# Patient Record
Sex: Female | Born: 1995 | Race: White | Hispanic: No | Marital: Single | Smoking: Never smoker
Health system: Southern US, Community
[De-identification: ages and names within clinical notes are randomized; demographics above are authoritative.]

## PROBLEM LIST (undated history)

## (undated) DIAGNOSIS — G43909 Migraine, unspecified, not intractable, without status migrainosus: Secondary | ICD-10-CM

## (undated) HISTORY — PX: OTHER SURGICAL HISTORY: SHX169

## (undated) HISTORY — DX: Migraine, unspecified, not intractable, without status migrainosus: G43.909

---
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
## 2016-01-11 ENCOUNTER — Ambulatory Visit (INDEPENDENT_AMBULATORY_CARE_PROVIDER_SITE_OTHER): Payer: Medicaid Other | Admitting: Neurology

## 2016-01-11 ENCOUNTER — Encounter: Payer: Self-pay | Admitting: Neurology

## 2016-01-11 VITALS — BP 110/70 | Ht 59.5 in | Wt 119.9 lb

## 2016-01-11 DIAGNOSIS — G44209 Tension-type headache, unspecified, not intractable: Secondary | ICD-10-CM | POA: Diagnosis not present

## 2016-01-11 DIAGNOSIS — G43009 Migraine without aura, not intractable, without status migrainosus: Secondary | ICD-10-CM

## 2016-01-11 DIAGNOSIS — G43701 Chronic migraine without aura, not intractable, with status migrainosus: Secondary | ICD-10-CM | POA: Insufficient documentation

## 2016-01-11 DIAGNOSIS — F329 Major depressive disorder, single episode, unspecified: Secondary | ICD-10-CM | POA: Diagnosis not present

## 2016-01-11 DIAGNOSIS — G4723 Circadian rhythm sleep disorder, irregular sleep wake type: Secondary | ICD-10-CM | POA: Diagnosis not present

## 2016-01-11 DIAGNOSIS — F411 Generalized anxiety disorder: Secondary | ICD-10-CM | POA: Diagnosis not present

## 2016-01-11 DIAGNOSIS — F32A Depression, unspecified: Secondary | ICD-10-CM | POA: Insufficient documentation

## 2016-01-11 MED ORDER — SUMATRIPTAN SUCCINATE 50 MG PO TABS: 50.0000 mg | ORAL_TABLET | ORAL | Status: DC | PRN

## 2016-01-11 NOTE — Progress Notes (Signed)
Patient: Nicole Francis MRN: [id number] Sex: female DOB: 09/09/1996  Provider: Keturah Shavers, MD Location of Care: [facility]  Note type: New patient consultation  Referral Source: Dr. Suzanna Obey History from: patient and referring office Chief Complaint: Migraines  History of Present Illness: Nicole Francis is a 20 y.o. female has been referred for evaluation and management of headaches. As per patient she has been having headaches off and on for the past few years but recently they have been more frequent, on average once a week for which she may need to take OTC medications. The headache is described as global headache with various intensity from 3-9 out of 10, accompanied by photophobia and phonophobia, occasional nausea and vomiting, occasional dizziness. The headaches may last for several hours to a few days and occasionally more than a week. Usually the headaches do not respond to medications although she is not taking appropriate dose of medications. She has tried 500 MG Tylenol as well as 25 mg of Imitrex with no significant help. She has some difficulty sleeping through the night with falling sleep late and frequent awakening from sleep. She may be sleepy throughout the day. She is also having significant stress or anxiety issues as well as depressed mood for which she has been followed by psychologist and has been on antidepressant medication. She is doing fairly well at school and she is going to start college next year. There is family history of headache in her mother. She lives with her grandmother and there lot of family social issues going on.  Review of Systems: 12 system review as per HPI, otherwise negative.  History reviewed. No pertinent past medical history. Hospitalizations: No., Head Injury: No., Nervous System Infections: No., Immunizations up to date: Yes.    Birth History She was born full-term via C-section with no perinatal events.  Her birthweight was 7 lbs. 8 oz. She developed all her milestones on time.  Surgical History Past Surgical History  Procedure Laterality Date  . Other surgical history Left     Nexplanon     Family History family history includes ADD / ADHD in her brother; Bipolar disorder in her mother; High Cholesterol in her mother; High blood pressure in her mother; Migraines in her mother.  Social History Social History   Social History  . Marital Status: Single    Spouse Name: N/A  . Number of Children: 0  . Years of Education: N/A   Social History Main Topics  . Smoking status: Never Smoker   . Smokeless tobacco: Never Used  . Alcohol Use: No  . Drug Use: No  . Sexual Activity: Yes    Birth Control/ Protection: Implant   Other Topics Concern  . None   Social History Narrative   Wenonah attends 12 th grade at [employer]. She is doing well.   She lives with her maternal grandmother and maternal half brother.      The medication list was reviewed and reconciled. All changes or newly prescribed medications were explained.  A complete medication list was provided to the patient/caregiver.  No Known Allergies  Physical Exam BP 110/70 mmHg  Ht 4' 11.5" (1.511 m)  Wt 119 lb 14.9 oz (54.4 kg)  BMI 23.83 kg/m2 Gen: Awake, alert, not in distress Skin: Eczema rash over the neck area, No neurocutaneous stigmata. HEENT: Normocephalic, no conjunctival injection, nares patent, mucous membranes moist, oropharynx clear. Neck: Supple, no meningismus. No focal tenderness. Resp: Clear to  auscultation bilaterally CV: Regular rate, normal S1/S2, no murmurs, no rubs Abd: BS present, abdomen soft, non-tender, non-distended. No hepatosplenomegaly or mass Ext: Warm and well-perfused. No deformities, no muscle wasting, ROM full.  Neurological Examination: MS: Awake, alert, interactive. Normal eye contact, answered the questions appropriately, speech was fluent,  Normal comprehension.   Attention and concentration were normal. Cranial Nerves: Pupils were equal and reactive to light ( 5-483mm);  normal fundoscopic exam with sharp discs, visual field full with confrontation test; EOM normal, no nystagmus; no ptsosis, no double vision, intact facial sensation, face symmetric with full strength of facial muscles, hearing intact to finger rub bilaterally, palate elevation is symmetric, tongue protrusion is symmetric with full movement to both sides.  Sternocleidomastoid and trapezius are with normal strength. Tone-Normal Strength-Normal strength in all muscle groups DTRs-  Biceps Triceps Brachioradialis Patellar Ankle  R 2+ 2+ 2+ 3+ 3+  L 2+ 2+ 2+ 3+ 3+   Plantar responses flexor bilaterally, no clonus noted Sensation: Intact to light touch, Romberg negative. Coordination: No dysmetria on FTN test. No difficulty with balance. Gait: Normal walk and run. Tandem gait was normal. Was able to perform toe walking and heel walking without difficulty.   Assessment and Plan 1. Migraine without aura and without status migrainosus, not intractable   2. Tension headache   3. Circadian rhythm sleep disorder, irregular sleep wake type   4. Anxiety state   5. Depression    This is an [age] year old young female with episodes of migraine and tension-type headaches, most likely related to several triggers including anxiety, depression, difficulty sleeping and genetic factors. She has no focal findings on her neurological examination. Discussed the nature of primary headache disorders with patient and family.  Encouraged diet and life style modifications including increase fluid intake, adequate sleep, limited screen time, eating breakfast.  I also discussed the stress and anxiety and association with headache. She will make a headache area and bring it on her next visit. Acute headache management: may take Motrin/Tylenol with appropriate dose (Max 3 times a week) and rest in a dark room. She may also  try 50 MG gram of Imitrex with or without 400 mg of ibuprofen. Preventive management: recommend dietary supplements including magnesium and Vitamin B2 (Riboflavin) which may be beneficial for migraine headaches in some studies. She may also take small dose of melatonin to help her with sleep if she is still having difficulty falling asleep. Since the frequency of headaches are not significantly high, I do not recommend starting a preventive medication but depends on her headache diary, I may consider a preventive medication such as propranolol or Topamax. I would like to see her in 2 months for follow-up visit and adjusting the medications if needed. She understood and agreed with the plan.   Meds ordered this encounter  Medications  . FLUoxetine (PROZAC) 10 MG capsule    Sig: Take 10 mg by mouth daily.   [name]. FLUoxetine (PROZAC) 40 MG capsule    Sig: Take 40 mg by mouth daily.   [name]. DISCONTD: SUMAtriptan (IMITREX) 25 MG tablet    Sig: Take 25 mg by mouth. Reported on 01/11/2016  . etonogestrel (NEXPLANON) 68 MG IMPL implant    Sig: 68 mg by Subdermal route once.   . Magnesium Oxide 500 MG TABS    Sig: Take by mouth.  . riboflavin (VITAMIN B-2) 100 MG TABS tablet    Sig: Take 100 mg by mouth daily.  . Melatonin 5 MG TABS  Sig: Take by mouth.  . SUMAtriptan (IMITREX) 50 MG tablet    Sig: Take 1 tablet (50 mg total) by mouth every 2 (two) hours as needed for migraine. Maximum 2 times a week    Dispense:  10 tablet    Refill:  0

## 2016-01-11 NOTE — Patient Instructions (Signed)
Have appropriate hydration, limited screen time Sleep at specific times every night, melatonin may help with sleep.  need at least 9 hours of sleep every night. Take dietary supplements regularly every day Take 50 mg of Imitrex with 400 mg of ibuprofen at the beginning of headache and sleep in a dark room for 30 minutes

## 2016-03-07 ENCOUNTER — Ambulatory Visit (INDEPENDENT_AMBULATORY_CARE_PROVIDER_SITE_OTHER): Payer: Medicaid Other | Admitting: Neurology

## 2016-03-07 ENCOUNTER — Encounter: Payer: Self-pay | Admitting: Neurology

## 2016-03-07 VITALS — BP 90/62 | Ht 59.75 in | Wt 119.7 lb

## 2016-03-07 DIAGNOSIS — G43009 Migraine without aura, not intractable, without status migrainosus: Secondary | ICD-10-CM

## 2016-03-07 DIAGNOSIS — F32A Depression, unspecified: Secondary | ICD-10-CM

## 2016-03-07 DIAGNOSIS — F411 Generalized anxiety disorder: Secondary | ICD-10-CM

## 2016-03-07 DIAGNOSIS — F329 Major depressive disorder, single episode, unspecified: Secondary | ICD-10-CM | POA: Diagnosis not present

## 2016-03-07 DIAGNOSIS — G44209 Tension-type headache, unspecified, not intractable: Secondary | ICD-10-CM | POA: Diagnosis not present

## 2016-03-07 NOTE — Progress Notes (Signed)
Patient: Nicole Francis MRN: [id number] Sex: female DOB: 1996/04/17  Provider: Keturah ShaversNABIZADEH, Sargon Scouten, MD [facility]  Note type: Routine return visit  Referral Source: Dr. Suzanna Obeyeleste Wallace History from: patient, referring office and [facility] Chief Complaint: Migraines  History of Present Illness: Nicole Francis is a 20 y.o. female is here for follow-up management of headaches. She was seen 2 months ago with episodes of headaches with moderate intensity and frequency with features of both migraine and tension-type headaches as well as having anxiety and depressed mood and difficulty sleeping through the night. On her last visit she was recommended to start taking dietary supplements as well as having appropriate asleep and limited screen time but she was not started on preventive medication. She was on antidepressant medication as well as melatonin at that point. Over the past couple of months she's been doing fairly well with no frequent headaches and over the past month she has not been taking more than one or 2 OTC medications. Currently she is taking dietary supplements and she has been taking Prozac and on therapy as well. She usually sleeps well without any difficulty and overall she thinks that she is doing better in terms of frequency and intensity of the headaches.  Review of Systems: 12 system review as per HPI, otherwise negative.  History reviewed. No pertinent past medical history. Hospitalizations: No., Head Injury: No., Nervous System Infections: No., Immunizations up to date: Yes.    Surgical History Past Surgical History  Procedure Laterality Date  . Other surgical history Left     Nexplanon     Family History family history includes ADD / ADHD in her brother; Bipolar disorder in her mother; High Cholesterol in her mother; High blood pressure in her mother; Migraines in her mother.   Social History Social History   Social History   . Marital Status: Single    Spouse Name: N/A  . Number of Children: 0  . Years of Education: N/A   Social History Main Topics  . Smoking status: Never Smoker   . Smokeless tobacco: Never Used  . Alcohol Use: No  . Drug Use: No  . Sexual Activity: Yes    Birth Control/ Protection: Implant   Other Topics Concern  . None   Social History Narrative   MusicianKendall graduated from high school. She plans on attending [employer] in the fall and majoring in Psychology.   She lives with her adoptive grandmother and biological, maternal half-brother.     The medication list was reviewed and reconciled. All changes or newly prescribed medications were explained.  A complete medication list was provided to the patient/caregiver.  No Known Allergies  Physical Exam BP 90/62 mmHg  Ht 4' 11.75" (1.518 m)  Wt 119 lb 11.4 oz (54.3 kg)  BMI 23.56 kg/m2 Gen: Awake, alert, not in distress Skin: No rash, No neurocutaneous stigmata. HEENT: Normocephalic, no conjunctival injection, nares patent, mucous membranes moist, oropharynx clear. Neck: Supple, no meningismus. No focal tenderness. Resp: Clear to auscultation bilaterally CV: Regular rate, normal S1/S2, no murmurs,  Abd: BS present, abdomen soft, non-tender, non-distended. No hepatosplenomegaly or mass Ext: Warm and well-perfused. No deformities, no muscle wasting,   Neurological Examination: MS: Awake, alert, interactive. Normal eye contact, answered the questions appropriately, speech was fluent,  Normal comprehension.  Attention and concentration were normal. Cranial Nerves: Pupils were equal and reactive to light ( 5-543mm);  normal fundoscopic exam with sharp discs, visual field full with confrontation  test; EOM normal, no nystagmus; no ptsosis, no double vision, intact facial sensation, face symmetric with full strength of facial muscles, hearing intact to finger rub bilaterally, palate elevation is symmetric, tongue protrusion is symmetric with full  movement to both sides.  Sternocleidomastoid and trapezius are with normal strength. Tone-Normal Strength-Normal strength in all muscle groups DTRs-  Biceps Triceps Brachioradialis Patellar Ankle  R 2+ 2+ 2+ 2+ 2+  L 2+ 2+ 2+ 2+ 2+   Plantar responses flexor bilaterally, no clonus noted Sensation: Intact to light touch,  Romberg negative. Coordination: No dysmetria on FTN test. No difficulty with balance. Gait: Normal walk and run. Tandem gait was normal.    Assessment and Plan 1. Migraine without aura and without status migrainosus, not intractable   2. Tension headache   3. Anxiety state   4. Depression    This is an 20 year old young female with episodes of migraine and tension-type headaches as well as anxiety and depressed mood with some difficulty sleeping through the night for which she has been on antidepressant medication and melatonin as well as dietary supplements with fairly good improvement and no frequent headaches and the current time. Since she is doing fairly well, I do not recommend further neurological evaluation or preventive treatment at this point. She will continue with appropriate hydration and sleep and limited screen time as well as continuing taking dietary supplements. She will continue follow with behavioral health service and her therapy. If she develops more frequent headaches, she may call the office to make an appointment to start her on preventive medication otherwise she'll continue follow-up with her primary care physician and I will be available for any question or concerns. She understood and agreed with the plan.

## 2016-04-20 ENCOUNTER — Encounter: Payer: Self-pay | Admitting: Pediatrics

## 2016-04-21 ENCOUNTER — Encounter: Payer: Self-pay | Admitting: Pediatrics

## 2016-06-17 ENCOUNTER — Ambulatory Visit (HOSPITAL_COMMUNITY)
Admission: EM | Admit: 2016-06-17 | Discharge: 2016-06-17 | Disposition: A | Discharge status: Home / Self Care | Payer: Medicaid Other | Attending: Physician Assistant | Admitting: Physician Assistant

## 2016-06-17 ENCOUNTER — Encounter (HOSPITAL_COMMUNITY): Payer: Self-pay | Admitting: Emergency Medicine

## 2016-06-17 DIAGNOSIS — B9689 Other specified bacterial agents as the cause of diseases classified elsewhere: Secondary | ICD-10-CM | POA: Diagnosis not present

## 2016-06-17 DIAGNOSIS — B3731 Acute candidiasis of vulva and vagina: Secondary | ICD-10-CM

## 2016-06-17 DIAGNOSIS — R300 Dysuria: Secondary | ICD-10-CM | POA: Insufficient documentation

## 2016-06-17 DIAGNOSIS — L298 Other pruritus: Secondary | ICD-10-CM | POA: Diagnosis not present

## 2016-06-17 DIAGNOSIS — R102 Pelvic and perineal pain: Secondary | ICD-10-CM | POA: Diagnosis not present

## 2016-06-17 DIAGNOSIS — A499 Bacterial infection, unspecified: Secondary | ICD-10-CM | POA: Diagnosis not present

## 2016-06-17 DIAGNOSIS — N760 Acute vaginitis: Secondary | ICD-10-CM | POA: Diagnosis not present

## 2016-06-17 DIAGNOSIS — B373 Candidiasis of vulva and vagina: Secondary | ICD-10-CM | POA: Diagnosis not present

## 2016-06-17 LAB — POCT URINALYSIS DIP (DEVICE)
BILIRUBIN URINE: NEGATIVE
GLUCOSE, UA: NEGATIVE mg/dL
Hgb urine dipstick: NEGATIVE
KETONES UR: NEGATIVE mg/dL
Nitrite: NEGATIVE
Protein, ur: 100 mg/dL — AB
SPECIFIC GRAVITY, URINE: 1.015 (ref 1.005–1.030)
Urobilinogen, UA: 0.2 mg/dL (ref 0.0–1.0)
pH: 8.5 — ABNORMAL HIGH (ref 5.0–8.0)

## 2016-06-17 LAB — POCT PREGNANCY, URINE: PREG TEST UR: NEGATIVE

## 2016-06-17 MED ORDER — METRONIDAZOLE 500 MG PO TABS: 500.0000 mg | ORAL_TABLET | Freq: Three times a day (TID) | ORAL | 0 refills | Status: DC

## 2016-06-17 MED ORDER — FLUCONAZOLE 200 MG PO TABS: 200.0000 mg | ORAL_TABLET | Freq: Every day | ORAL | 0 refills | Status: AC

## 2016-06-17 NOTE — ED Triage Notes (Signed)
Pt is here for vaginal pain onset x4 weeks ++ associated w/vaginal burning, itching, dysuria, abd pain  Denies vag d/c, fevers  SA in a monogamous relationship and has Nexplanon in place and uses condoms  A&O x4... NAD

## 2016-06-20 LAB — CERVICOVAGINAL ANCILLARY ONLY
Chlamydia: NEGATIVE
Neisseria Gonorrhea: NEGATIVE
Wet Prep (BD Affirm): NEGATIVE

## 2017-07-17 IMAGING — MR MR HEAD W/O CM
9 of 11 series · 33 of 48 positions shown · non-contrast
Comparison: None.

CLINICAL DATA: Left-sided facial numbness and tongue numbness
beginning this morning.

EXAM:
MRI HEAD WITHOUT CONTRAST
TECHNIQUE: Multiplanar, multiecho pulse sequences of the brain and surrounding
structures were obtained without intravenous contrast.

[Series 2: FLAIR · sagittal · 5.0mm · 0.47mm/px · 2 of 24 slices shown (1 of 2)]
[im 1/24]
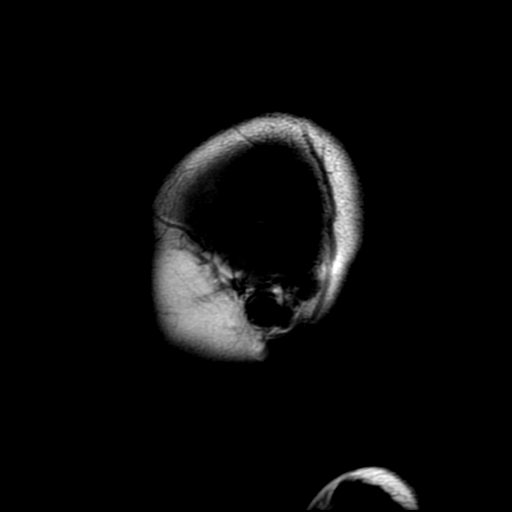
[im 24/24]
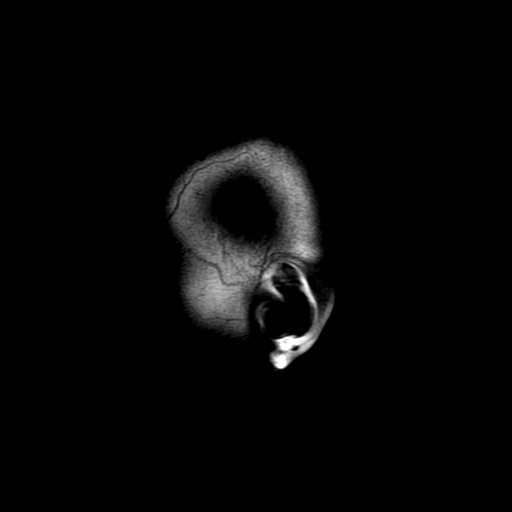

[Series 5: T2 · axial · 5.0mm · 0.45mm/px · z∈[-63,+79]mm · 2 of 25 slices shown (1 of 2)]
[im 1/25]
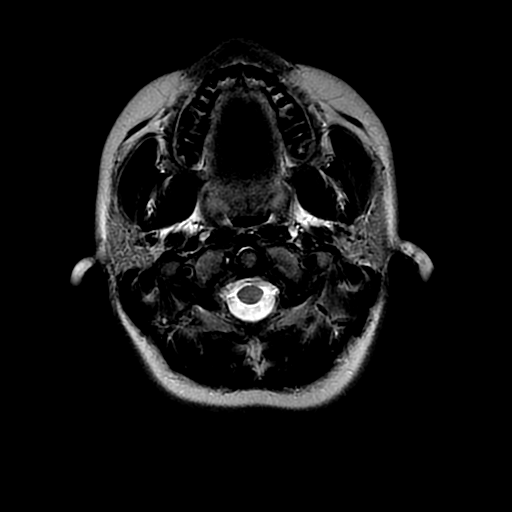
[im 25/25]
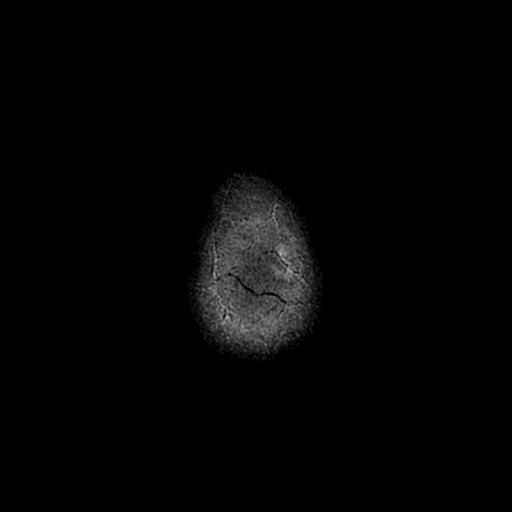

[Series 6: FLAIR · axial · 5.0mm · 0.45mm/px · z∈[-63,+79]mm · 2 of 25 slices shown (2 of 2)]
[im 1/25]
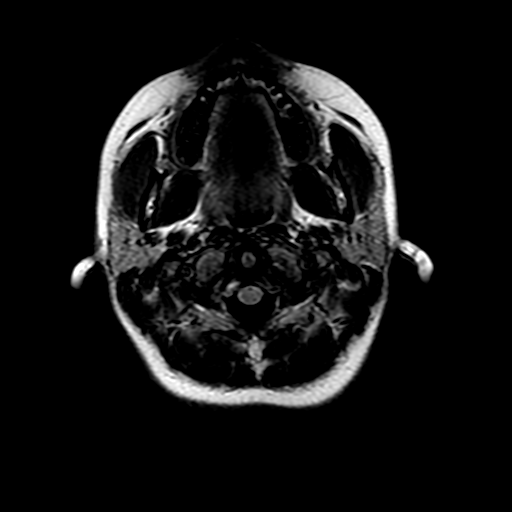
[im 25/25]
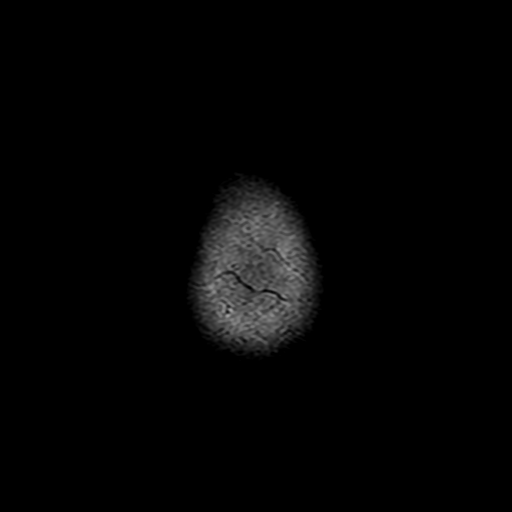

[Series 7: (person_name) · axial · 3.2mm · 0.47mm/px · z∈[-45,+16]mm · 4 of 80 slices shown]
[im 1/80]
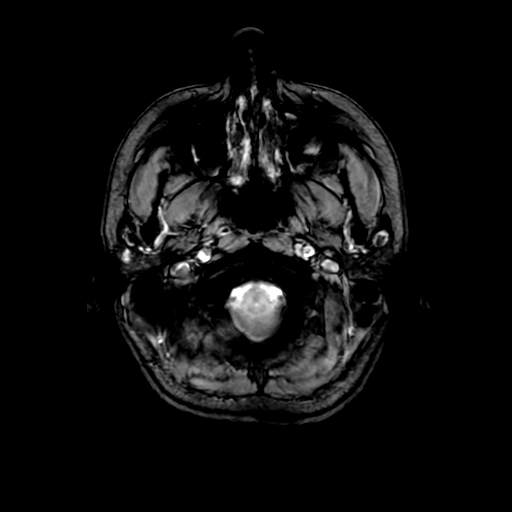
[im 14/80]
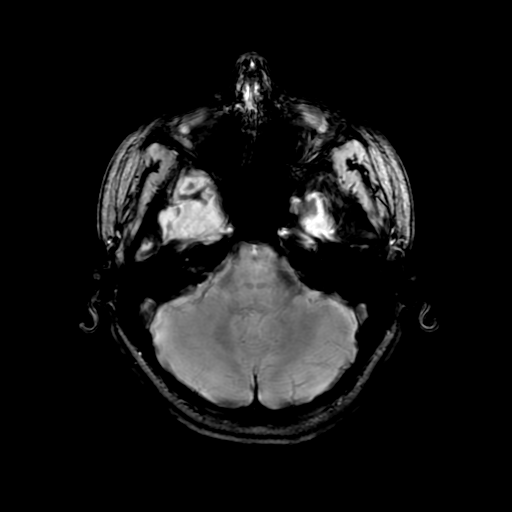
[im 27/80]
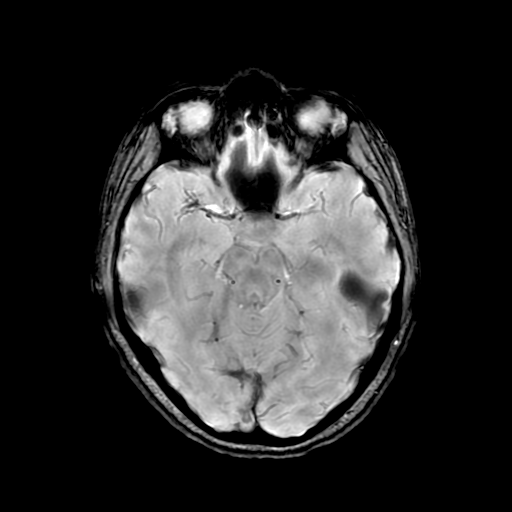
[im 40/80]
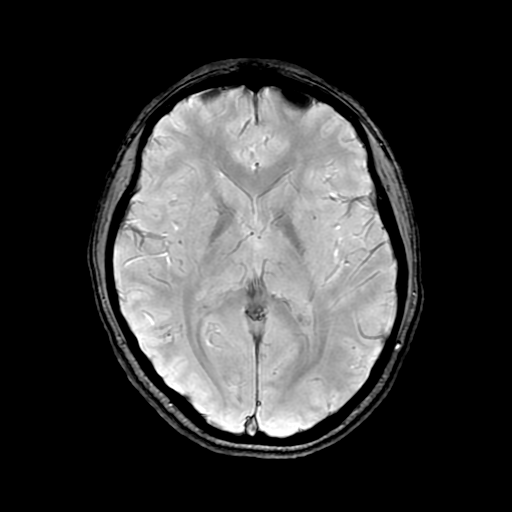

[Series 8: DWI · axial · 3.6mm · 1.02mm/px · z∈[-42,+94]mm · 7 of 78 slices shown (1 of 4)]
[im 1/78]
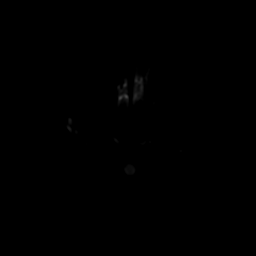
[im 13/78]
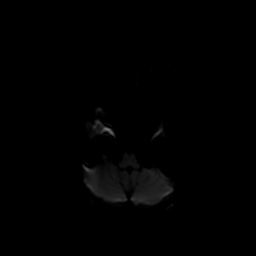
[im 26/78]
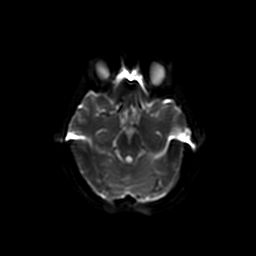
[im 39/78]
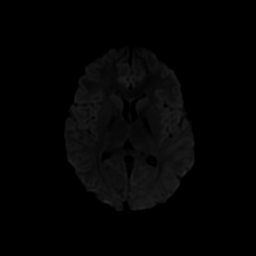
[im 52/78]
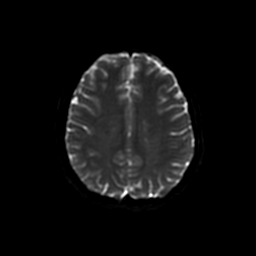
[im 65/78]
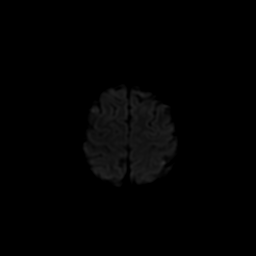
[im 78/78]
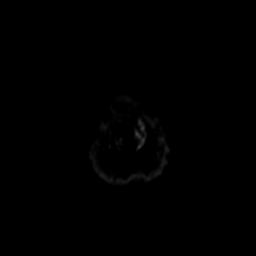

[Series 9: DWI · coronal · 5.0mm · 0.94mm/px · 6 of 64 slices shown (2 of 4)]
[im 1/64]
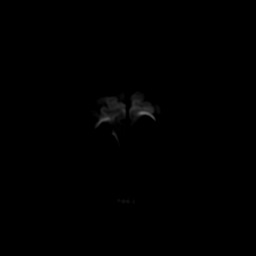
[im 13/64]
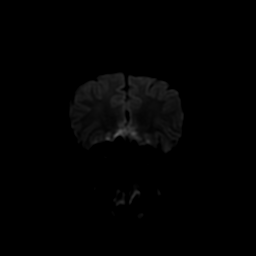
[im 26/64]
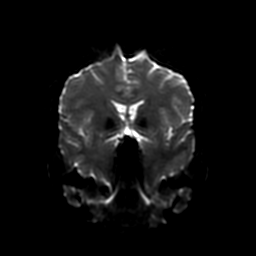
[im 38/64]
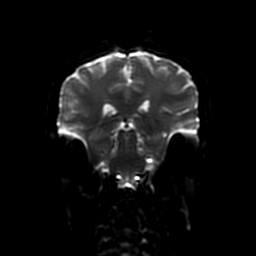
[im 51/64]
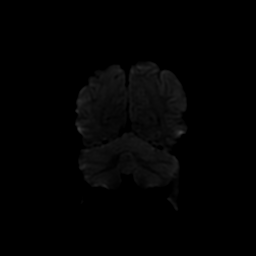
[im 64/64]
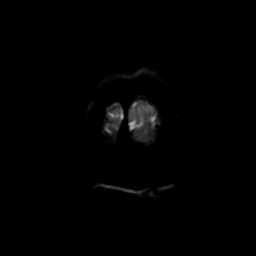

[Series 11: T2 · coronal · 5.0mm · 0.47mm/px · 3 of 28 slices shown (2 of 2)]
[im 1/28]
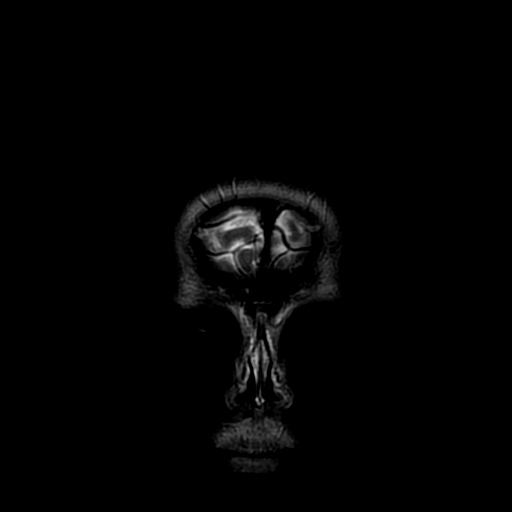
[im 14/28]
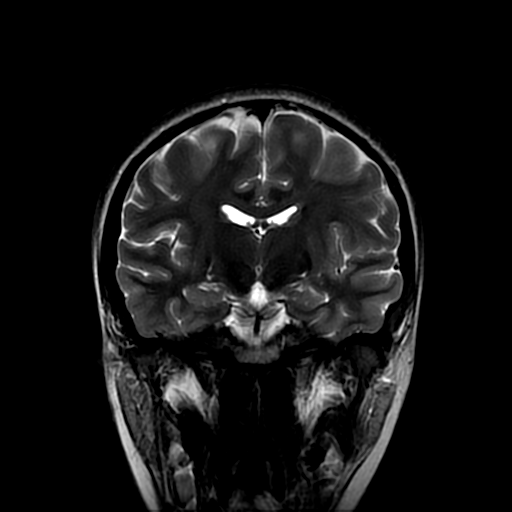
[im 28/28]
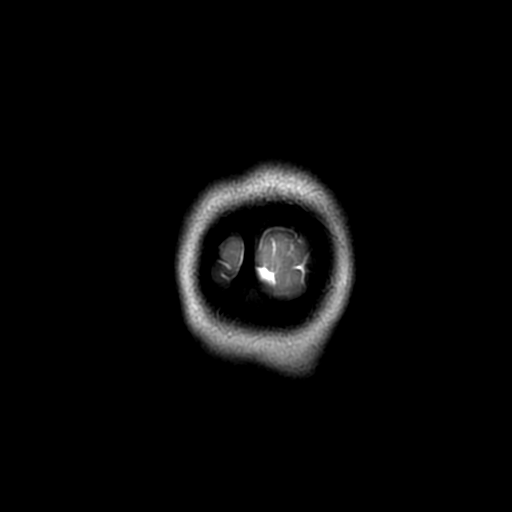

[Series 800: DWI · axial · 3.6mm · 1.02mm/px · z∈[-42,+94]mm · 4 of 39 slices shown (3 of 4)]
[im 1/39]
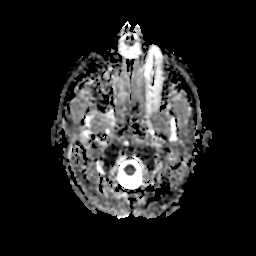
[im 13/39]
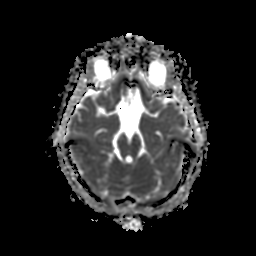
[im 26/39]
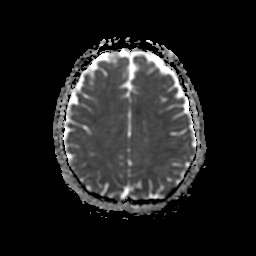
[im 39/39]
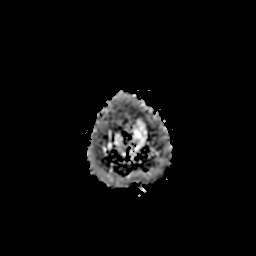

[Series 900: DWI · coronal · 5.0mm · 0.94mm/px · 3 of 32 slices shown (4 of 4)]
[im 1/32]
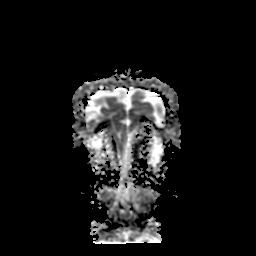
[im 16/32]
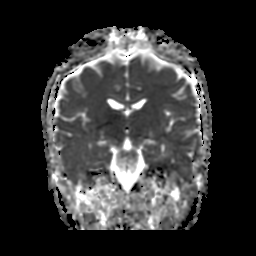
[im 32/32]
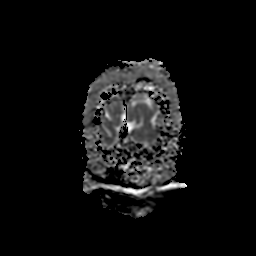

[33 of 48 positions shown; findings below may reference images not displayed]

FINDINGS: There is no evidence of acute infarct, intracranial hemorrhage,
mass, midline shift, or extra-axial fluid collection. Ventricles and
sulci are normal. Brain is normal in signal.

Orbits are unremarkable. There is mild bilateral ethmoid, sphenoid,
and maxillary sinus mucosal thickening with small fluid levels in
the left greater than right maxillary sinuses. Mastoid air cells
clear. Major intracranial vascular flow voids are preserved.
IMPRESSION: 1. Unremarkable appearance of the brain.
2. Paranasal sinus mucosal thickening and fluid. Correlate for acute
sinusitis.

## 2017-08-24 ENCOUNTER — Encounter: Payer: Self-pay | Admitting: Pediatrics

## 2017-08-24 ENCOUNTER — Encounter: Payer: Self-pay | Admitting: *Deleted

## 2017-08-24 ENCOUNTER — Other Ambulatory Visit: Payer: Self-pay

## 2017-08-24 ENCOUNTER — Ambulatory Visit (INDEPENDENT_AMBULATORY_CARE_PROVIDER_SITE_OTHER): Payer: Medicaid Other | Admitting: Pediatrics

## 2017-08-24 VITALS — BP 101/65 | HR 70 | Ht 60.0 in | Wt 150.2 lb

## 2017-08-24 DIAGNOSIS — Z113 Encounter for screening for infections with a predominantly sexual mode of transmission: Secondary | ICD-10-CM | POA: Diagnosis not present

## 2017-08-24 DIAGNOSIS — Z30017 Encounter for initial prescription of implantable subdermal contraceptive: Secondary | ICD-10-CM

## 2017-08-24 DIAGNOSIS — Z3046 Encounter for surveillance of implantable subdermal contraceptive: Secondary | ICD-10-CM

## 2017-08-24 MED ORDER — ETONOGESTREL 68 MG ~~LOC~~ IMPL
68.0000 mg | DRUG_IMPLANT | Freq: Once | SUBCUTANEOUS | Status: AC
  Administered 2017-08-24: 68 mg via SUBCUTANEOUS

## 2017-08-24 NOTE — Progress Notes (Signed)
Risks & benefits of Nexplanon removal discussed. Consent form signed.  The patient denies any allergies to anesthetics or antiseptics.  Procedure: Pt was placed in supine position. left arm was flexed at the elbow and externally rotated so that her wrist was parallel to her ear, The device was palpated and marked. The site was cleaned with Betadine. The area surrounding the device was covered with a sterile drape. 1% lidocaine was injected just under the device. A scalpel was used to create a small incision. The device was pushed towards the incision. Fibrous tissue surrounding the device was gradually removed from the device. The device was removed and measured to ensure all 4 cm of device was removed.  Nexplanon Insertion  No contraindications for placement.  No liver disease, no unexplained vaginal bleeding, no h/o breast cancer, no h/o blood clots.  No LMP recorded. Patient has had an implant.  UHCG: Neg  Last Unprotected sex:  None- remove and replace   Risks & benefits of Nexplanon discussed The nexplanon device [facility] Packaging instructions supplied to patient Consent form signed  The patient denies any allergies to anesthetics or antiseptics.  Procedure: Pt was placed in supine position. The left arm was flexed at the elbow and externally rotated so that her wrist was parallel to her ear The medial epicondyle of the left arm was identified The insertions site was marked 8 cm proximal to the medial epicondyle The insertion site was cleaned with Betadine The area surrounding the insertion site was covered with a sterile drape 1% lidocaine was injected just under the skin at the insertion site extending 4 cm proximally. The sterile preloaded disposable Nexaplanon applicator was removed from the sterile packaging The applicator needle was inserted at a 30 degree angle at 8 cm proximal to the medial epicondyle as marked The applicator was  lowered to a horizontal position and advanced just under the skin for the full length of the needle The slider on the applicator was retracted fully while the applicator remained in the same position, then the applicator was removed. The implant was confirmed via palpation as being in position The implant position was demonstrated to the patient Pressure dressing was applied to the patient.  The patient was instructed to removed the pressure dressing in 24 hrs.  The patient was advised to move slowly from a supine to an upright position  The patient denied any concerns or complaints  The patient was instructed to schedule a follow-up appt in 1 month and to call sooner if any concerns.  The patient acknowledged agreement and understanding of the plan.

## 2017-08-24 NOTE — Addendum Note (Signed)
Addended by: Ardeth SportsmanHODES, Candon Caras L on: 08/24/2017 11:28 AM   Modules accepted: Orders

## 2017-08-24 NOTE — Patient Instructions (Signed)
° °  Congratulations on getting your Nexplanon placement!  Below is some important information about Nexplanon. ° °First remember that Nexplanon does not prevent sexually transmitted infections.  Condoms will help prevent sexually transmitted infections. °The Nexplanon starts working 7 days after it was inserted.  There is a risk of getting pregnant if you have unprotected sex in those first 7 days after placement of the Nexplanon. ° °The Nexplanon lasts for 3 years but can be removed at any time.  You can become pregnant as early as 1 week after removal.  You can have a new Nexplanon put in after the old one is removed if you like. ° °It is not known whether Nexplanon is as effective in women who are very overweight because the studies did not include many overweight women. ° °Nexplanon interacts with some medications, including barbiturates, bosentan, carbamazepine, felbamate, griseofulvin, oxcarbazepine, phenytoin, rifampin, St. John's wort, topiramate, HIV medicines.  Please alert your doctor if you are on any of these medicines. ° °Always tell other healthcare providers that you have a Nexplanon in your arm. ° °The Nexplanon was placed just under the skin.  Leave the outside bandage on for 24 hours.  Leave the smaller bandage on for 3-5 days or until it falls off on its own.  Keep the area clean and dry for 3-5 days. °There is usually bruising or swelling at the insertion site for a few days to a week after placement.  If you see redness or pus draining from the insertion site, call us immediately. ° °Keep your user card with the date the implant was placed and the date the implant is to be removed. ° °The most common side effect is a change in your menstrual bleeding pattern.   This bleeding is generally not harmful to you but can be annoying.  Call or come in to see us if you have any concerns about the bleeding or if you have any side effects or questions.   ° °We will call you in 1 week to check in and we  would like you to return to the clinic for a follow-up visit in 1 month. ° °You can call [facility] 24 hours a day with any questions or concerns.  There is always a nurse or doctor available to take your call.  Call 9-1-1 if you have a life-threatening emergency.  For anything else, please call us at [phone] before heading to the ER. °

## 2017-08-25 LAB — C. TRACHOMATIS/N. GONORRHOEAE RNA
C. trachomatis RNA, TMA: NOT DETECTED
N. gonorrhoeae RNA, TMA: NOT DETECTED

## 2018-09-20 ENCOUNTER — Encounter (HOSPITAL_COMMUNITY): Payer: Self-pay | Admitting: Emergency Medicine

## 2018-09-20 ENCOUNTER — Other Ambulatory Visit: Payer: Self-pay

## 2018-09-20 ENCOUNTER — Emergency Department (HOSPITAL_COMMUNITY)
Admission: EM | Admit: 2018-09-20 | Discharge: 2018-09-20 | Disposition: A | Discharge status: Home / Self Care | Payer: Self-pay | Attending: Emergency Medicine | Admitting: Emergency Medicine

## 2018-09-20 DIAGNOSIS — G43809 Other migraine, not intractable, without status migrainosus: Secondary | ICD-10-CM | POA: Insufficient documentation

## 2018-09-20 DIAGNOSIS — H53149 Visual discomfort, unspecified: Secondary | ICD-10-CM | POA: Insufficient documentation

## 2018-09-20 DIAGNOSIS — R112 Nausea with vomiting, unspecified: Secondary | ICD-10-CM | POA: Insufficient documentation

## 2018-09-20 DIAGNOSIS — Z79899 Other long term (current) drug therapy: Secondary | ICD-10-CM | POA: Insufficient documentation

## 2018-09-20 MED ORDER — DIPHENHYDRAMINE HCL 50 MG/ML IJ SOLN
12.5000 mg | Freq: Once | INTRAMUSCULAR | Status: AC
  Administered 2018-09-20: 12.5 mg via INTRAVENOUS
  Filled 2018-09-20: qty 1

## 2018-09-20 MED ORDER — KETOROLAC TROMETHAMINE 30 MG/ML IJ SOLN
30.0000 mg | Freq: Once | INTRAMUSCULAR | Status: AC
  Administered 2018-09-20: 30 mg via INTRAVENOUS
  Filled 2018-09-20: qty 1

## 2018-09-20 MED ORDER — SODIUM CHLORIDE 0.9 % IV BOLUS
1000.0000 mL | Freq: Once | INTRAVENOUS | Status: AC
Start: 2018-09-20 — End: 2018-09-20
  Administered 2018-09-20: 1000 mL via INTRAVENOUS

## 2018-09-20 MED ORDER — PROCHLORPERAZINE EDISYLATE 10 MG/2ML IJ SOLN
10.0000 mg | Freq: Once | INTRAMUSCULAR | Status: AC
  Administered 2018-09-20: 10 mg via INTRAVENOUS
  Filled 2018-09-20: qty 2

## 2018-09-20 NOTE — ED Notes (Signed)
Patient is resting comfortably. 

## 2018-09-20 NOTE — ED Provider Notes (Signed)
[facility] Provider Note   CSN: [id number] Arrival date & time: 09/20/18  0901     History   Chief Complaint Chief Complaint  Patient presents with  . Migraine    HPI Nicole Francis is a 22 y.o. female.  HPI Patient presents to the emergency room for evaluation of a migraine headache.  Patient states the symptoms started a few days ago.  She has tried multiple medications and including Tylenol, ibuprofen as well as hydrocodone.  None of these treatments have resolved her headache.  The headache is diffuse.  Is associated with nausea and vomiting as well as photophobia.  This is typical for her migraine headaches.  Patient denies any fevers or chills.  No numbness or weakness.  No cough, chest pain shortness of breath. History reviewed. No pertinent past medical history.  Patient Active Problem List   Diagnosis Date Noted  . Migraine without aura and without status migrainosus, not intractable 01/11/2016  . Tension headache 01/11/2016  . Circadian rhythm sleep disorder, irregular sleep wake type 01/11/2016  . Anxiety state 01/11/2016  . Depression 01/11/2016  . Adjustment disorder with mixed anxiety and depressed mood 08/18/2015  . Insomnia 08/18/2015  . Eczema 10/03/2014  . Surveillance of implantable subdermal contraceptive 10/03/2014    Past Surgical History:  Procedure Laterality Date  . OTHER SURGICAL HISTORY Left    Nexplanon      OB History   No obstetric history on file.      Home Medications    Prior to Admission medications   Medication Sig Start Date End Date Taking? Authorizing Provider  acetaminophen (TYLENOL) 500 MG tablet Take 1,000 mg by mouth every 6 (six) hours as needed for headache.   Yes [provider]  QUEtiapine (SEROQUEL) 100 MG tablet Take 100 mg by mouth at bedtime.   Yes [provider]    Family History Family History  Problem Relation Age of Onset  . Bipolar disorder  Mother   . High Cholesterol Mother   . High blood pressure Mother   . Migraines Mother   . ADD / ADHD Brother     Social History Social History   Tobacco Use  . Smoking status: Never Smoker  . Smokeless tobacco: Never Used  Substance Use Topics  . Alcohol use: No    Alcohol/week: 0.0 standard drinks  . Drug use: No     Allergies   Patient has no known allergies.   Review of Systems Review of Systems  All other systems reviewed and are negative.    Physical Exam Updated Vital Signs BP (!) 101/57   Pulse 62   Temp 98.6 F (37 C) (Oral)   Resp 16   SpO2 100%   Physical Exam Vitals signs and nursing note reviewed.  Constitutional:      General: She is not in acute distress.    Appearance: She is well-developed.  HENT:     Head: Normocephalic and atraumatic.     Right Ear: External ear normal.     Left Ear: External ear normal.  Eyes:     General: No scleral icterus.       Right eye: No discharge.        Left eye: No discharge.     Conjunctiva/sclera: Conjunctivae normal.  Neck:     Musculoskeletal: Neck supple.     Trachea: No tracheal deviation.  Cardiovascular:     Rate and Rhythm: Normal rate and regular rhythm.  Pulmonary:     Effort: Pulmonary effort is normal. No respiratory distress.     Breath sounds: Normal breath sounds. No stridor. No wheezing or rales.  Abdominal:     General: Bowel sounds are normal. There is no distension.     Palpations: Abdomen is soft.     Tenderness: There is no abdominal tenderness. There is no guarding or rebound.  Musculoskeletal:        General: No tenderness.  Skin:    General: Skin is warm and dry.     Findings: No rash.  Neurological:     Mental Status: She is alert.     Cranial Nerves: No cranial nerve deficit (no facial droop, extraocular movements intact, no slurred speech).     Sensory: No sensory deficit.     Motor: No abnormal muscle tone or seizure activity.     Coordination: Coordination normal.        ED Treatments / Results  Labs (all labs ordered are listed, but only abnormal results are displayed) Labs Reviewed - No data to display  EKG None  Radiology No results found.  Procedures Procedures (including critical care time)  Medications Ordered in ED Medications  sodium chloride 0.9 % bolus 1,000 mL (0 mLs Intravenous Stopped 09/20/18 1110)  prochlorperazine (COMPAZINE) injection 10 mg (10 mg Intravenous Given 09/20/18 0951)  diphenhydrAMINE (BENADRYL) injection 12.5 mg (12.5 mg Intravenous Given 09/20/18 0952)  ketorolac (TORADOL) 30 MG/ML injection 30 mg (30 mg Intravenous Given 09/20/18 0951)     Initial Impression / Assessment and Plan / ED Course  I have reviewed the triage vital signs and the nursing notes.  Pertinent labs & imaging results that were available during my care of the patient were reviewed by me and considered in my medical decision making (see chart for details).  Clinical Course as of Sep 20 1157  Thu Sep 20, 2018  1055 Pt is sleeping.  Will reassess once she wakes up   [JK]  1158 Patient is feeling better.  She is ready to go home   [JK]    Clinical Course User Index [JK] Linwood DibblesKnapp, Eri Platten, MD    Patient presented to the emergency room with complaints of a migraine headache.  Her symptoms were classic for typical migraine.  She had no other signs or symptoms to suggest meningitis or subarachnoid hemorrhage.  Patient proved with treatment in the ED.  Stable for discharge and outpatient follow up prn  Final Clinical Impressions(s) / ED Diagnoses   Final diagnoses:  Other migraine without status migrainosus, not intractable    ED Discharge Orders    None       Linwood DibblesKnapp, Susi Goslin, MD 09/20/18 1158

## 2018-09-20 NOTE — Discharge Instructions (Addendum)
Continue over-the-counter medications as needed, follow-up with your primary care doctor if you continue have recurrent migraine headaches, return to the emergency room for fever, worsening symptoms

## 2018-09-20 NOTE — ED Triage Notes (Signed)
Pt states her Migraine has lasted 3 days and has taking multiple medications to help ease pain with no relief. Pt also states she has vomiting along with Migraine.

## 2019-04-01 ENCOUNTER — Other Ambulatory Visit: Payer: Self-pay

## 2019-04-01 ENCOUNTER — Encounter (HOSPITAL_COMMUNITY): Payer: Self-pay

## 2019-04-01 ENCOUNTER — Emergency Department (HOSPITAL_COMMUNITY)
Admission: EM | Admit: 2019-04-01 | Discharge: 2019-04-01 | Disposition: A | Discharge status: Home / Self Care | Payer: Self-pay | Attending: Emergency Medicine | Admitting: Emergency Medicine

## 2019-04-01 DIAGNOSIS — Z79899 Other long term (current) drug therapy: Secondary | ICD-10-CM | POA: Insufficient documentation

## 2019-04-01 DIAGNOSIS — L559 Sunburn, unspecified: Secondary | ICD-10-CM | POA: Insufficient documentation

## 2019-04-01 MED ORDER — BACITRACIN ZINC 500 UNIT/GM EX OINT: 1.0000 "application " | TOPICAL_OINTMENT | Freq: Two times a day (BID) | CUTANEOUS | 0 refills | Status: DC

## 2019-04-01 MED ORDER — TRAMADOL HCL 50 MG PO TABS: 50.0000 mg | ORAL_TABLET | Freq: Three times a day (TID) | ORAL | 0 refills | Status: DC | PRN

## 2019-04-01 NOTE — Discharge Instructions (Signed)
Apply bacitracin twice a day as prescribed. Use this in addition to thick creams or lotions. You may benefit to cover your back with gauze after application of these ointments/creams to avoid excess rubbing on your skin.   Take 600mg  ibuprofen every 6 hours for pain. Use this with 1000mg  Tylenol every 8 hours as needed. If pain is severe, use Tramadol as instructed.  Do not drive or drink alcohol after taking this medication as it may make you drowsy and impair your judgment.

## 2019-04-01 NOTE — ED Triage Notes (Signed)
Pt arrive POV for eval of severe sunburn to back from beach last week, appears scabbed. Pt reports pain is too severe to bear at this time. No open areas noted, encompasses nearly entire back

## 2019-04-01 NOTE — ED Provider Notes (Signed)
[facility]   CSN: [id number] Arrival date & time: 04/01/19  2040     History   Chief Complaint Chief Complaint  Patient presents with  . Sunburn    HPI Nicole Francis is a 23 y.o. female.     [age] year old female presents to the emergency department for evaluation of a sunburn to her back.  Onset of sunburn was 5 days ago.  She has been taking 400 mg ibuprofen consistently without improvement to her pain.  Also reports applying ice and topical aloe vera without relief.  Came in this evening due to for pain control.  Pain is aggravated with contact of shirt or applied pressure.  States that the area was weeping initially, but has not had any drainage recently.  The history is provided by the patient. No language interpreter was used.    History reviewed. No pertinent past medical history.  Patient Active Problem List   Diagnosis Date Noted  . Migraine without aura and without status migrainosus, not intractable 01/11/2016  . Tension headache 01/11/2016  . Circadian rhythm sleep disorder, irregular sleep wake type 01/11/2016  . Anxiety state 01/11/2016  . Depression 01/11/2016  . Adjustment disorder with mixed anxiety and depressed mood 08/18/2015  . Insomnia 08/18/2015  . Eczema 10/03/2014  . Surveillance of implantable subdermal contraceptive 10/03/2014    Past Surgical History:  Procedure Laterality Date  . OTHER SURGICAL HISTORY Left    Nexplanon      OB History   No obstetric history on file.      Home Medications    Prior to Admission medications   Medication Sig Start Date End Date Taking? Authorizing Provider  acetaminophen (TYLENOL) 500 MG tablet Take 1,000 mg by mouth every 6 (six) hours as needed for headache.    [provider]  bacitracin ointment Apply 1 application topically 2 (two) times daily. 04/01/19   Antony MaduraHumes, Kevis Qu, PA-C  QUEtiapine (SEROQUEL) 100 MG tablet Take 100 mg by mouth at  bedtime.    [provider]  traMADol (ULTRAM) 50 MG tablet Take 1 tablet (50 mg total) by mouth every 8 (eight) hours as needed for severe pain. 04/01/19   Antony MaduraHumes, Graciemae Delisle, PA-C    Family History Family History  Problem Relation Age of Onset  . Bipolar disorder Mother   . High Cholesterol Mother   . High blood pressure Mother   . Migraines Mother   . ADD / ADHD Brother     Social History Social History   Tobacco Use  . Smoking status: Never Smoker  . Smokeless tobacco: Never Used  Substance Use Topics  . Alcohol use: No    Alcohol/week: 0.0 standard drinks  . Drug use: No     Allergies   Patient has no known allergies.   Review of Systems Review of Systems Ten systems reviewed and are negative for acute change, except as noted in the HPI.    Physical Exam Updated Vital Signs BP 134/77 (BP Location: Right Arm)   Pulse 71   Temp 99.1 F (37.3 C)   Resp 16   Ht 5' (1.524 m)   Wt 68 kg   SpO2 98%   BMI 29.29 kg/m   Physical Exam Vitals signs and nursing note reviewed.  Constitutional:      General: She is not in acute distress.    Appearance: She is well-developed. She is not diaphoretic.     Comments: Nontoxic appearing and in  NAD  HENT:     Head: Normocephalic and atraumatic.  Eyes:     General: No scleral icterus.    Conjunctiva/sclera: Conjunctivae normal.  Neck:     Musculoskeletal: Normal range of motion.  Pulmonary:     Effort: Pulmonary effort is normal. No respiratory distress.     Comments: Respirations even and unlabored Musculoskeletal: Normal range of motion.  Skin:    General: Skin is warm and dry.     Coloration: Skin is not pale.     Findings: Rash present.     Comments: Healing sunburn to near entirety of back. Skin peeling and scabbing noted. No drainage, induration, weeping.   Neurological:     Mental Status: She is alert and oriented to person, place, and time.  Psychiatric:        Behavior: Behavior normal.         ED Treatments / Results  Labs (all labs ordered are listed, but only abnormal results are displayed) Labs Reviewed - No data to display  EKG None  Radiology No results found.  Procedures Procedures (including critical care time)  Medications Ordered in ED Medications - No data to display   Initial Impression / Assessment and Plan / ED Course  I have reviewed the triage vital signs and the nursing notes.  Pertinent labs & imaging results that were available during my care of the patient were reviewed by me and considered in my medical decision making (see chart for details).        23 year old female presents for healing sunburn.  Onset of burn was 5 days ago.  Various stages of healing with areas of skin peeling and scabbing.  Have continued to encourage supportive measures with topical bacitracin and thick ointments/lotions.  Patient advised to continue use of 400 mg ibuprofen every 6 hours.  Will give a few tablets of tramadol to take for severe pain as needed.  Patient appropriate for follow-up with her primary care doctor.  Return precautions discussed and provided. Patient discharged in stable condition with no unaddressed concerns.   Final Clinical Impressions(s) / ED Diagnoses   Final diagnoses:  Sunburn    ED Discharge Orders         Ordered    bacitracin ointment  2 times daily     04/01/19 2328    traMADol (ULTRAM) 50 MG tablet  Every 8 hours PRN     04/01/19 2328           Antonietta Breach, PA-C 04/01/19 2354    Dina Rich Barbette Hair, MD 04/06/19 [phone]

## 2019-05-16 ENCOUNTER — Other Ambulatory Visit: Payer: Self-pay

## 2019-05-16 DIAGNOSIS — Z20822 Contact with and (suspected) exposure to covid-19: Secondary | ICD-10-CM

## 2019-05-17 LAB — NOVEL CORONAVIRUS, NAA: SARS-CoV-2, NAA: NOT DETECTED

## 2020-02-18 ENCOUNTER — Ambulatory Visit (INDEPENDENT_AMBULATORY_CARE_PROVIDER_SITE_OTHER)
Admission: RE | Admit: 2020-02-18 | Discharge: 2020-02-18 | Disposition: A | Discharge status: Home / Self Care | Payer: Self-pay | Source: Ambulatory Visit

## 2020-02-18 DIAGNOSIS — R112 Nausea with vomiting, unspecified: Secondary | ICD-10-CM

## 2020-02-18 DIAGNOSIS — G8929 Other chronic pain: Secondary | ICD-10-CM

## 2020-02-18 DIAGNOSIS — R519 Headache, unspecified: Secondary | ICD-10-CM

## 2020-02-18 MED ORDER — ONDANSETRON HCL 4 MG PO TABS: 4.0000 mg | ORAL_TABLET | Freq: Four times a day (QID) | ORAL | 0 refills | Status: DC

## 2020-02-18 MED ORDER — MELOXICAM 15 MG PO TABS: 15.0000 mg | ORAL_TABLET | Freq: Every day | ORAL | 0 refills | Status: DC

## 2020-02-18 NOTE — ED Provider Notes (Signed)
[facility] CARE CENTER    Virtual Visit via Video Note:  FERRIS TALLY  initiated request for Telemedicine visit with [facility] Urgent [facility] team. I connected with Delman Cheadle  on 02/18/2020 at 3:31 PM  for a synchronized telemedicine visit using a video enabled HIPPA compliant telemedicine application. I verified that I am speaking with Delman Cheadle  using two identifiers. Rennis Harding, PA-C  was physically located in a [facility] Urgent care site and SIMCHA FARRINGTON was located at a different location.   The limitations of evaluation and management by telemedicine as well as the availability of in-person appointments were discussed. Patient was informed that she  may incur a bill ( including co-pay) for this virtual visit encounter. Delman Cheadle  expressed understanding and gave verbal consent to proceed with virtual visit.   [id number] 02/18/20 Arrival Time: 1520  CC: nausea, vomiting, and migraine  SUBJECTIVE:  Nicole Francis is a 24 y.o. female who presents with complaint of migraines, nausea, and vomiting x 2 months.  States symptoms occur in the morning and prior to going to work.  Does not have symptoms when she is off work  Denies a precipitating event, trauma, close contacts with similar symptoms, recent travel or antibiotic use.   Has tried OTC medications without relief.  Denies alleviating or aggravating factors.  Denies similar symptoms in the past.    Denies fever, chills, chest pain, SOB, diarrhea, constipation, hematochezia, melena, dysuria, difficulty urinating, increased frequency or urgency, flank pain, loss of bowel or bladder function, vaginal discharge, vaginal odor, vaginal bleeding, dyspareunia, pelvic pain.     No LMP recorded. Patient has had an implant.  NO LMP with nexplanon.  Has had negative pregnancy test at home.    ROS: As per HPI.  All other pertinent ROS negative.     History reviewed. No pertinent past medical history. Past  Surgical History:  Procedure Laterality Date  . OTHER SURGICAL HISTORY Left    Nexplanon    No Known Allergies No current facility-administered medications on file prior to encounter.   Current Outpatient Medications on File Prior to Encounter  Medication Sig Dispense Refill  . acetaminophen (TYLENOL) 500 MG tablet Take 1,000 mg by mouth every 6 (six) hours as needed for headache.    . bacitracin ointment Apply 1 application topically 2 (two) times daily. 453.6 g 0  . QUEtiapine (SEROQUEL) 100 MG tablet Take 100 mg by mouth at bedtime.    . traMADol (ULTRAM) 50 MG tablet Take 1 tablet (50 mg total) by mouth every 8 (eight) hours as needed for severe pain. 10 tablet 0     OBJECTIVE:  There were no vitals filed for this visit.  General appearance: alert; no distress Eyes: EOMI grossly HENT: normocephalic; atraumatic Neck: supple with FROM Lungs: normal respiratory effort; speaking in full sentences without difficulty Extremities: moves extremities without difficulty Skin: No obvious rashes Neurologic: No facial asymmetries Psychological: alert and cooperative; normal mood and affect   ASSESSMENT & PLAN:  1. Chronic nonintractable headache, unspecified headache type   2. Non-intractable vomiting with nausea, unspecified vomiting type     Meds ordered this encounter  Medications  . meloxicam (MOBIC) 15 MG tablet    Sig: Take 1 tablet (15 mg total) by mouth daily.    Dispense:  30 tablet    Refill:  0    Order Specific Question:   Supervising Provider    Answer:   Eustace Moore [  [id number]]  . ondansetron (ZOFRAN) 4 MG tablet    Sig: Take 1 tablet (4 mg total) by mouth every 6 (six) hours.    Dispense:  30 tablet    Refill:  0    Order Specific Question:   Supervising Provider    Answer:   Raylene Everts [[id number]]    Mobic prescribed.  Take as directed for migraine headaches Zofran prescribed.  Use as needed for nausea and vomiting Begin keeping a journal of  activities, stressors, and diet to identify possible triggers that may contribute to symptoms Please follow up with PCP for further evaluation and management Follow up in person, call 911, or go to the ED if you have any new or worsening symptoms such as fever, chills, fatigue, worst headache of life, vision changes, slurred speech, facial droop, weakness in limbs, abdominal pain, persistent nonstop vomiting, diarrhea, persistent symptoms despite treatment, etc...  I discussed the assessment and treatment plan with the patient. The patient was provided an opportunity to ask questions and all were answered. The patient agreed with the plan and demonstrated an understanding of the instructions.   The patient was advised to call back or seek an in-person evaluation if the symptoms worsen or if the condition fails to improve as anticipated.  I provided 10 minutes of non-face-to-face time during this encounter.  [place], PA-C  02/18/2020 3:31 PM    Stacey Drain [place], PA-C 02/18/20 1533

## 2020-02-18 NOTE — Discharge Instructions (Signed)
Mobic prescribed.  Take as directed for migraine headaches Zofran prescribed.  Use as needed for nausea and vomiting Begin keeping a journal of activities, stressors, and diet to identify possible triggers that may contribute to symptoms Please follow up with PCP for further evaluation and management Follow up in person, call 911, or go to the ED if you have any new or worsening symptoms such as fever, chills, fatigue, worst headache of life, vision changes, slurred speech, facial droop, weakness in limbs, abdominal pain, persistent nonstop vomiting, diarrhea, persistent symptoms despite treatment, etc..[name]

## 2020-03-14 ENCOUNTER — Emergency Department (HOSPITAL_COMMUNITY)
Admission: EM | Admit: 2020-03-14 | Discharge: 2020-03-15 | Disposition: A | Discharge status: Home / Self Care | Payer: Self-pay | Attending: Emergency Medicine | Admitting: Emergency Medicine

## 2020-03-14 DIAGNOSIS — G43801 Other migraine, not intractable, with status migrainosus: Secondary | ICD-10-CM | POA: Insufficient documentation

## 2020-03-14 DIAGNOSIS — R1084 Generalized abdominal pain: Secondary | ICD-10-CM | POA: Insufficient documentation

## 2020-03-14 DIAGNOSIS — Z79899 Other long term (current) drug therapy: Secondary | ICD-10-CM | POA: Insufficient documentation

## 2020-03-14 DIAGNOSIS — R197 Diarrhea, unspecified: Secondary | ICD-10-CM | POA: Insufficient documentation

## 2020-03-14 LAB — CBC
HCT: 41.2 % (ref 36.0–46.0)
Hemoglobin: 13.2 g/dL (ref 12.0–15.0)
MCH: 30.8 pg (ref 26.0–34.0)
MCHC: 32 g/dL (ref 30.0–36.0)
MCV: 96.3 fL (ref 80.0–100.0)
Platelets: 292 10*3/uL (ref 150–400)
RBC: 4.28 MIL/uL (ref 3.87–5.11)
RDW: 12.7 % (ref 11.5–15.5)
WBC: 10.3 10*3/uL (ref 4.0–10.5)
nRBC: 0 % (ref 0.0–0.2)

## 2020-03-14 LAB — COMPREHENSIVE METABOLIC PANEL
ALT: 32 U/L (ref 0–44)
AST: 20 U/L (ref 15–41)
Albumin: 4.8 g/dL (ref 3.5–5.0)
Alkaline Phosphatase: 57 U/L (ref 38–126)
Anion gap: 13 (ref 5–15)
BUN: 12 mg/dL (ref 6–20)
CO2: 21 mmol/L — ABNORMAL LOW (ref 22–32)
Calcium: 9.5 mg/dL (ref 8.9–10.3)
Chloride: 101 mmol/L (ref 98–111)
Creatinine, Ser: 0.71 mg/dL (ref 0.44–1.00)
GFR calc Af Amer: >60 mL/min (ref >60)
GFR calc non Af Amer: >60 mL/min (ref >60)
Glucose, Bld: 108 mg/dL — ABNORMAL HIGH (ref 70–99)
Potassium: 3.6 mmol/L (ref 3.5–5.1)
Sodium: 135 mmol/L (ref 135–145)
Total Bilirubin: 1.7 mg/dL — ABNORMAL HIGH (ref 0.3–1.2)
Total Protein: 8.2 g/dL — ABNORMAL HIGH (ref 6.5–8.1)

## 2020-03-14 LAB — URINALYSIS, ROUTINE W REFLEX MICROSCOPIC
Bilirubin Urine: NEGATIVE
Glucose, UA: NEGATIVE mg/dL
Hgb urine dipstick: NEGATIVE
Ketones, ur: 80 mg/dL — AB
Leukocytes,Ua: NEGATIVE
Nitrite: NEGATIVE
Protein, ur: 30 mg/dL — AB
Specific Gravity, Urine: 1.032 — ABNORMAL HIGH (ref 1.005–1.030)
pH: 5 (ref 5.0–8.0)

## 2020-03-14 LAB — I-STAT BETA HCG BLOOD, ED (MC, WL, AP ONLY): I-stat hCG, quantitative: <5 m[IU]/mL (ref <5)

## 2020-03-14 LAB — LIPASE, BLOOD: Lipase: 25 U/L (ref 11–51)

## 2020-03-14 MED ORDER — SODIUM CHLORIDE 0.9% FLUSH
3.0000 mL | Freq: Once | INTRAVENOUS | Status: AC
  Administered 2020-03-15: 3 mL via INTRAVENOUS

## 2020-03-14 NOTE — ED Triage Notes (Signed)
Onset several days migraines.  Onset early am abd pain and vomiting.   Took tylenol several times today with no relief in headache.

## 2020-03-15 ENCOUNTER — Other Ambulatory Visit: Payer: Self-pay

## 2020-03-15 MED ORDER — BUTALBITAL-APAP-CAFFEINE 50-325-40 MG PO TABS: 1.0000 | ORAL_TABLET | Freq: Three times a day (TID) | ORAL | 0 refills | Status: DC | PRN

## 2020-03-15 MED ORDER — DROPERIDOL 2.5 MG/ML IJ SOLN
1.2500 mg | Freq: Once | INTRAMUSCULAR | Status: AC
  Administered 2020-03-15: 1.25 mg via INTRAVENOUS
  Filled 2020-03-15: qty 2

## 2020-03-15 MED ORDER — SODIUM CHLORIDE 0.9 % IV BOLUS
1000.0000 mL | Freq: Once | INTRAVENOUS | Status: AC
  Administered 2020-03-15: 1000 mL via INTRAVENOUS

## 2020-03-15 MED ORDER — METOCLOPRAMIDE HCL 10 MG PO TABS
10.0000 mg | ORAL_TABLET | Freq: Four times a day (QID) | ORAL | 0 refills | Status: DC | PRN
Start: 2020-03-15 — End: 2020-08-11

## 2020-03-15 MED ORDER — DIPHENHYDRAMINE HCL 50 MG/ML IJ SOLN
12.5000 mg | Freq: Once | INTRAMUSCULAR | Status: AC
  Administered 2020-03-15: 12.5 mg via INTRAVENOUS
  Filled 2020-03-15: qty 1

## 2020-03-15 NOTE — Discharge Instructions (Addendum)

## 2020-03-15 NOTE — ED Provider Notes (Signed)
[facility] EMERGENCY DEPARTMENT Provider Note   CSN: [id number] Arrival date & time: 03/14/20  2131     History Chief Complaint  Patient presents with  . Abdominal Pain  . Emesis    CORBIN HOTT is a 24 y.o. female who presents with headache.  She has a hx of chronic, frequent headaches, and has never seen a neurologist.  The patient reports several days of headaches, worse in the morning when she awakens.  She has some vomiting.  She has had vomiting over the past 3 days.  She has generalized abdominal pain and mild diarrhea.  She denies urinary symptoms, fevers.  She denies neck stiffness, photophobia or phonophobia.  The headache is described as tight, frontal and on the top of her head.  She denies vision changes, unilateral weakness, difficulty with speech or swallowing.  She has taken Tylenol without relief.  She has been unable to hold down Tylenol due to vomiting.  Last menstrual period last week.  HPI     No past medical history on file.  Patient Active Problem List   Diagnosis Date Noted  . Migraine without aura and without status migrainosus, not intractable 01/11/2016  . Tension headache 01/11/2016  . Circadian rhythm sleep disorder, irregular sleep wake type 01/11/2016  . Anxiety state 01/11/2016  . Depression 01/11/2016  . Adjustment disorder with mixed anxiety and depressed mood 08/18/2015  . Insomnia 08/18/2015  . Eczema 10/03/2014  . Surveillance of implantable subdermal contraceptive 10/03/2014    Past Surgical History:  Procedure Laterality Date  . OTHER SURGICAL HISTORY Left    Nexplanon      OB History   No obstetric history on file.     Family History  Problem Relation Age of Onset  . Bipolar disorder Mother   . High Cholesterol Mother   . High blood pressure Mother   . Migraines Mother   . ADD / ADHD Brother     Social History   Tobacco Use  . Smoking status: Never Smoker  . Smokeless tobacco: Never Used    Substance Use Topics  . Alcohol use: No    Alcohol/week: 0.0 standard drinks  . Drug use: No    Home Medications Prior to Admission medications   Medication Sig Start Date End Date Taking? Authorizing Provider  acetaminophen (TYLENOL) 500 MG tablet Take 1,000 mg by mouth every 6 (six) hours as needed for headache.   Yes [provider]  meloxicam (MOBIC) 15 MG tablet Take 1 tablet (15 mg total) by mouth daily. 02/18/20  Yes Wurst, [place], PA-C  ondansetron (ZOFRAN) 4 MG tablet Take 1 tablet (4 mg total) by mouth every 6 (six) hours. Patient taking differently: Take 4 mg by mouth every 6 (six) hours as needed for nausea or vomiting.  02/18/20  Yes Wurst, [place], PA-C  oxymetazoline (AFRIN) 0.05 % nasal spray Place 1 spray into both nostrils 2 (two) times daily as needed for congestion.   Yes [provider]  QUEtiapine (SEROQUEL) 50 MG tablet Take 50 mg by mouth at bedtime.   Yes [provider]  sertraline (ZOLOFT) 50 MG tablet Take 50 mg by mouth daily. 11/25/19  Yes [provider]  bacitracin ointment Apply 1 application topically 2 (two) times daily. Patient not taking: Reported on 03/15/2020 04/01/19   Antony Madura, PA-C  traMADol (ULTRAM) 50 MG tablet Take 1 tablet (50 mg total) by mouth every 8 (eight) hours as needed for severe pain. Patient not  taking: Reported on 03/15/2020 04/01/19   Antonietta Breach, PA-C    Allergies    Patient has no known allergies.  Review of Systems   Review of Systems Ten systems reviewed and are negative for acute change, except as noted in the HPI.   Physical Exam Updated Vital Signs BP 111/67 (BP Location: Right Arm)   Pulse 65   Temp 98 F (36.7 C) (Oral)   Resp 18   Ht 5' (1.524 m)   Wt 59 kg   LMP 03/02/2020   SpO2 100%   BMI 25.39 kg/m   Physical Exam Physical Exam  Constitutional: Pt is oriented to person, place, and time. Pt appears well-developed and well-nourished. No distress.  HENT:  Head:  Normocephalic and atraumatic.  Mouth/Throat: Oropharynx is clear and moist.  Eyes: Conjunctivae and EOM are normal. Pupils are equal, round, and reactive to light. No scleral icterus.  No horizontal, vertical or rotational nystagmus  Neck: Normal range of motion. Neck supple.  Full active and passive ROM without pain No midline or paraspinal tenderness No nuchal rigidity or meningeal signs  Cardiovascular: Normal rate, regular rhythm and intact distal pulses.   Pulmonary/Chest: Effort normal and breath sounds normal. No respiratory distress. Pt has no wheezes. No rales.  Abdominal: Soft. Bowel sounds are normal. There is generalized tenderness. There is no rebound and no guarding.  Musculoskeletal: Normal range of motion.  Lymphadenopathy:    No cervical adenopathy.  Neurological: Pt. is alert and oriented to person, place, and time. He has normal reflexes. No cranial nerve deficit.  Exhibits normal muscle tone. Coordination normal.  Mental Status:  Alert, oriented, thought content appropriate. Speech fluent without evidence of aphasia. Able to follow 2 step commands without difficulty.  Cranial Nerves:  II:  Peripheral visual fields grossly normal, pupils equal, round, reactive to light III,IV, VI: ptosis not present, extra-ocular motions intact bilaterally  V,VII: smile symmetric, facial light touch sensation equal VIII: hearing grossly normal bilaterally  IX,X: midline uvula rise  XI: bilateral shoulder shrug equal and strong XII: midline tongue extension  Motor:  5/5 in upper and lower extremities bilaterally including strong and equal grip strength and dorsiflexion/plantar flexion Sensory: Pinprick and light touch normal in all extremities.  Deep Tendon Reflexes: 2+ and symmetric  Cerebellar: normal finger-to-nose with bilateral upper extremities Gait: normal gait and balance CV: distal pulses palpable throughout   Skin: Skin is warm and dry. No rash noted. Pt is not diaphoretic.   Psychiatric: Pt has a normal mood and affect. Behavior is normal. Judgment and thought content normal.  Nursing note and vitals reviewed.   ED Results / Procedures / Treatments   Labs (all labs ordered are listed, but only abnormal results are displayed) Labs Reviewed  COMPREHENSIVE METABOLIC PANEL - Abnormal; Notable for the following components:      Result Value   CO2 21 (*)    Glucose, Bld 108 (*)    Total Protein 8.2 (*)    Total Bilirubin 1.7 (*)    All other components within normal limits  URINALYSIS, ROUTINE W REFLEX MICROSCOPIC - Abnormal; Notable for the following components:   APPearance CLOUDY (*)    Specific Gravity, Urine 1.032 (*)    Ketones, ur 80 (*)    Protein, ur 30 (*)    Bacteria, UA RARE (*)    All other components within normal limits  LIPASE, BLOOD  CBC  I-STAT BETA HCG BLOOD, ED ([facility], [facility], [facility] ONLY)    EKG  None  Radiology No results found.  Procedures Procedures (including critical care time)  Medications Ordered in ED Medications  sodium chloride 0.9 % bolus 1,000 mL (has no administration in time range)  droperidol (INAPSINE) 2.5 MG/ML injection 1.25 mg (has no administration in time range)  diphenhydrAMINE (BENADRYL) injection 12.5 mg (has no administration in time range)  sodium chloride flush (NS) 0.9 % injection 3 mL (3 mLs Intravenous Given 03/15/20 0636)    ED Course  I have reviewed the triage vital signs and the nursing notes.  Pertinent labs & imaging results that were available during my care of the patient were reviewed by me and considered in my medical decision making (see chart for details).    MDM Rules/Calculators/A&P                          This patient complains of headache, this involves an extensive number of treatment options, and is a complaint that carries with it a high risk of complications and morbidity.  The differential diagnosis includes Emergent considerations for headache include subarachnoid hemorrhage,  meningitis, temporal arteritis, glaucoma, cerebral ischemia, carotid/vertebral dissection, intracranial tumor, Venous sinus thrombosis, carbon monoxide poisoning, acute or chronic subdural hemorrhage.  Other considerations include: Migraine, Cluster headache, Hypertension, Caffeine, alcohol, or drug withdrawal, Pseudotumor cerebri, Arteriovenous malformation, Head injury, Neurocysticercosis, Post-lumbar puncture, Preeclampsia, Tension headache, Sinusitis, Cervical arthritis, Refractive error causing strain, Dental abscess, Otitis media, Temporomandibular joint syndrome, Depression, Somatoform disorder (eg, somatization) Trigeminal neuralgia, Glossopharyngeal neuralgia.   I Ordered, reviewed, and interpreted labs, which included CBC, lipase within normal limits, CMP with slightly low bicarb level of insignificant value.  Glucose of 108 likely secondary to acute phase reaction.  Urine is negative for infection but does show ketones secondary to patient's vomiting and poor p.o. intake.  Bilirubin just above normal and of insignificant value.  Negative pregnancy test. I ordered medication droperidol and Benadryl for headache, nausea and abdominal pain  Additional history obtained from significant other at bedside Previous records obtained and reviewed    Critical interventions: Medications as above  After the interventions stated above, I reevaluated the patient and found resolution of headache and abdominal pain  Patient here with headache. She has had vomiting.  I think that her belly pain and vomiting are all related to her migraine.  Secondarily I do believe the dehydration is contributing to her symptoms.  She is given a liter of fluid, medications for treatment of migraine and nausea.  She has relate resolution of her symptoms and is encouraged to follow closely with neurology.  No evidence of meningitis, subarachnoid hemorrhage, she has no   and appears otherwise appropriate for discharge at this  time  Final Clinical Impression(s) / ED Diagnoses Final diagnoses:  None    Rx / DC Orders ED Discharge Orders    None       Arthor Captain, PA-C 03/15/20 1551    Little, Ambrose [place], MD 03/18/20 [phone]

## 2020-03-24 ENCOUNTER — Ambulatory Visit: Payer: Self-pay | Admitting: Neurology

## 2020-03-24 ENCOUNTER — Other Ambulatory Visit: Payer: Self-pay

## 2020-03-24 ENCOUNTER — Encounter: Payer: Self-pay | Admitting: Neurology

## 2020-03-24 VITALS — BP 114/68 | HR 88 | Ht 60.0 in | Wt 131.4 lb

## 2020-03-24 DIAGNOSIS — G43701 Chronic migraine without aura, not intractable, with status migrainosus: Secondary | ICD-10-CM

## 2020-03-24 MED ORDER — TOPIRAMATE 50 MG PO TABS
50.0000 mg | ORAL_TABLET | Freq: Every evening | ORAL | 6 refills | Status: DC
Start: 2020-03-24 — End: 2020-06-24

## 2020-03-24 MED ORDER — ONDANSETRON 4 MG PO TBDP
4.0000 mg | ORAL_TABLET | Freq: Three times a day (TID) | ORAL | 3 refills | Status: DC | PRN
Start: 2020-03-24 — End: 2020-08-04

## 2020-03-24 MED ORDER — RIZATRIPTAN BENZOATE 10 MG PO TBDP
10.0000 mg | ORAL_TABLET | ORAL | 11 refills | Status: DC | PRN
Start: 2020-03-24 — End: 2021-04-13

## 2020-03-24 MED ORDER — NURTEC 75 MG PO TBDP: 75.0000 mg | ORAL_TABLET | Freq: Every day | ORAL | 0 refills | Status: DC | PRN

## 2020-03-24 NOTE — Patient Instructions (Addendum)
Rizatriptan: Please take one tablet at the onset of your headache. If it does not improve the symptoms please take one additional tablet. Do not take more then 2 tablets in 24hrs. Do not take use more then 2 to 3 times in a week. Topiramate 50mg  at bedtime. Let me know how it is going! In 3-4 weeks email and we may increase it if needed. For Nausea: Ondansetron Rizatriptan: Please take one tablet at the onset of your headache. If it does not improve the symt take use more then 2 to 3 times in a week. For the next 6 days take Nurtec daily and then as needed for migraine (may be used with zofran and the Rizatriptan)  Rimegepant oral dissolving tablet What is this medicine? RIMEGEPANT (ri ME je pant) is used to treat migraine headaches with or without aura. An aura is a strange feeling or visual disturbance that warns you of an attack. It is not used to prevent migraines. This medicine may be used for other purposes; ask your health care provider or pharmacist if you have questions. COMMON BRAND NAME(S): NURTEC ODT What should I tell my health care provider before I take this medicine? They need to know if you have any of these conditions:  kidney disease  liver disease  an unusual or allergic reaction to rimegepant, other medicines, foods, dyes, or preservatives  pregnant or trying to get pregnant  breast-feeding How should I use this medicine? Take the medicine by mouth. Follow the directions on the prescription label. Leave the tablet in the sealed blister pack until you are ready to take it. With dry hands, open the blister and gently remove the tablet. If the tablet breaks or crumbles, throw it away and take a new tablet out of the blister pack. Place the tablet in the mouth and allow it to dissolve, and then swallow. Do not cut, crush, or chew this medicine. You do not need water to take this medicine. Talk to your pediatrician about the use of this medicine in children. Special care may  be needed. Overdosage: If you think you have taken too much of this medicine contact a poison control center or emergency room at once. NOTE: This medicine is only for you. Do not share this medicine with others. What if I miss a dose? This does not apply. This medicine is not for regular use. What may interact with this medicine? This medicine may interact with the following medications:  certain medicines for fungal infections like fluconazole, itraconazole  rifampin This list may not describe all possible interactions. Give your health care provider a list of all the medicines, herbs, non-prescription drugs, or dietary supplements you use. Also tell them if you smoke, drink alcohol, or use illegal drugs. Some items may interact with your medicine. What should I watch for while using this medicine? Visit your health care professional for regular checks on your progress. Tell your health care professional if your symptoms do not start to get better or if they get worse. What side effects may I notice from receiving this medicine? Side effects that you should report to your doctor or health care professional as soon as possible:  allergic reactions like skin rash, itching or hives; swelling of the face, lips, or tongue Side effects that usually do not require medical attention (report these to your doctor or health care professional if they continue or are bothersome):  nausea This list may not describe all possible side effects. Call your doctor  for medical advice about side effects. You may report side effects to FDA at 1-800-FDA-1088. Where should I keep my medicine? Keep out of the reach of children. Store at room temperature between 15 and 30 degrees C (59 and 86 degrees F). Throw away any unused medicine after the expiration date. NOTE: This sheet is a summary. It may not cover all possible information. If you have questions about this medicine, talk to your doctor, pharmacist, or  health care provider.  2020 Elsevier/Gold Standard (2018-11-26 00:21:31) Ondansetron oral dissolving tablet What is this medicine? ONDANSETRON (on DAN se tron) is used to treat nausea and vomiting caused by chemotherapy. It is also used to prevent or treat nausea and vomiting after surgery. This medicine may be used for other purposes; ask your health care provider or pharmacist if you have questions. COMMON BRAND NAME(S): Zofran ODT What should I tell my health care provider before I take this medicine? They need to know if you have any of these conditions:  heart disease  history of irregular heartbeat  liver disease  low levels of magnesium or potassium in the blood  an unusual or allergic reaction to ondansetron, granisetron, other medicines, foods, dyes, or preservatives  pregnant or trying to get pregnant  breast-feeding How should I use this medicine? These tablets are made to dissolve in the mouth. Do not try to push the tablet through the foil backing. With dry hands, peel away the foil backing and gently remove the tablet. Place the tablet in the mouth and allow it to dissolve, then swallow. While you may take these tablets with water, it is not necessary to do so. Talk to your pediatrician regarding the use of this medicine in children. Special care may be needed. Overdosage: If you think you have taken too much of this medicine contact a poison control center or emergency room at once. NOTE: This medicine is only for you. Do not share this medicine with others. What if I miss a dose? If you miss a dose, take it as soon as you can. If it is almost time for your next dose, take only that dose. Do not take double or extra doses. What may interact with this medicine? Do not take this medicine with any of the following medications:  apomorphine  certain medicines for fungal infections like fluconazole, itraconazole, ketoconazole, posaconazole,  voriconazole  cisapride  dronedarone  pimozide  thioridazine This medicine may also interact with the following medications:  carbamazepine  certain medicines for depression, anxiety, or psychotic disturbances  fentanyl  linezolid  MAOIs like Carbex, Eldepryl, Marplan, Nardil, and Parnate  methylene blue (injected into a vein)  other medicines that prolong the QT interval (cause an abnormal heart rhythm) like dofetilide, ziprasidone  phenytoin  rifampicin  tramadol This list may not describe all possible interactions. Give your health care provider a list of all the medicines, herbs, non-prescription drugs, or dietary supplements you use. Also tell them if you smoke, drink alcohol, or use illegal drugs. Some items may interact with your medicine. What should I watch for while using this medicine? Check with your doctor or health care professional as soon as you can if you have any sign of an allergic reaction. What side effects may I notice from receiving this medicine? Side effects that you should report to your doctor or health care professional as soon as possible:  allergic reactions like skin rash, itching or hives, swelling of the face, lips, or tongue  breathing problems  confusion  dizziness  fast or irregular heartbeat  feeling faint or lightheaded, falls  fever and chills  loss of balance or coordination  seizures  sweating  swelling of the hands and feet  tightness in the chest  tremors  unusually weak or tired Side effects that usually do not require medical attention (report to your doctor or health care professional if they continue or are bothersome):  constipation or diarrhea  headache This list may not describe all possible side effects. Call your doctor for medical advice about side effects. You may report side effects to FDA at 1-800-FDA-1088. Where should I keep my medicine? Keep out of the reach of children. Store between 2  and 30 degrees C (36 and 86 degrees F). Throw away any unused medicine after the expiration date. NOTE: This sheet is a summary. It may not cover all possible information. If you have questions about this medicine, talk to your doctor, pharmacist, or health care provider.  2020 Elsevier/Gold Standard (2018-09-04 07:14:10) Rizatriptan disintegrating tablets What is this medicine? RIZATRIPTAN (rye za TRIP tan) is used to treat migraines with or without aura. An aura is a strange feeling or visual disturbance that warns you of an attack. It is not used to prevent migraines. This medicine may be used for other purposes; ask your health care provider or pharmacist if you have questions. COMMON BRAND NAME(S): Maxalt-MLT What should I tell my health care provider before I take this medicine? They need to know if you have any of these conditions:  cigarette smoker  circulation problems in fingers and toes  diabetes  heart disease  high blood pressure  high cholesterol  history of irregular heartbeat  history of stroke  kidney disease  liver disease  stomach or intestine problems  an unusual or allergic reaction to rizatriptan, other medicines, foods, dyes, or preservatives  pregnant or trying to get pregnant  breast-feeding How should I use this medicine? Take this medicine by mouth. Follow the directions on the prescription label. Leave the tablet in the sealed blister pack until you are ready to take it. With dry hands, open the blister and gently remove the tablet. If the tablet breaks or crumbles, throw it away and take a new tablet out of the blister pack. Place the tablet in the mouth and allow it to dissolve, and then swallow. Do not cut, crush, or chew this medicine. You do not need water to take this medicine. Do not take it more often than directed. Talk to your pediatrician regarding the use of this medicine in children. While this drug may be prescribed for children as  young as 6 years for selected conditions, precautions do apply. Overdosage: If you think you have taken too much of this medicine contact a poison control center or emergency room at once. NOTE: This medicine is only for you. Do not share this medicine with others. What if I miss a dose? This does not apply. This medicine is not for regular use. What may interact with this medicine? Do not take this medicine with any of the following medicines:  certain medicines for migraine headache like almotriptan, eletriptan, frovatriptan, naratriptan, rizatriptan, sumatriptan, zolmitriptan  ergot alkaloids like dihydroergotamine, ergonovine, ergotamine, methylergonovine  MAOIs like Carbex, Eldepryl, Marplan, Nardil, and Parnate This medicine may also interact with the following medications:  certain medicines for depression, anxiety, or psychotic disorders  propranolol This list may not describe all possible interactions. Give your health care provider a list of all  the medicines, herbs, non-prescription drugs, or dietary supplements you use. Also tell them if you smoke, drink alcohol, or use illegal drugs. Some items may interact with your medicine. What should I watch for while using this medicine? Visit your healthcare professional for regular checks on your progress. Tell your healthcare professional if your symptoms do not start to get better or if they get worse. You may get drowsy or dizzy. Do not drive, use machinery, or do anything that needs mental alertness until you know how this medicine affects you. Do not stand up or sit up quickly, especially if you are an older patient. This reduces the risk of dizzy or fainting spells. Alcohol may interfere with the effect of this medicine. Your mouth may get dry. Chewing sugarless gum or sucking hard candy and drinking plenty of water may help. Contact your healthcare professional if the problem does not go away or is severe. If you take migraine  medicines for 10 or more days a month, your migraines may get worse. Keep a diary of headache days and medicine use. Contact your healthcare professional if your migraine attacks occur more frequently. What side effects may I notice from receiving this medicine? Side effects that you should report to your doctor or health care professional as soon as possible:  allergic reactions like skin rash, itching or hives, swelling of the face, lips, or tongue  chest pain or chest tightness  signs and symptoms of a dangerous change in heartbeat or heart rhythm like chest pain; dizziness; fast, irregular heartbeat; palpitations; feeling faint or lightheaded; falls; breathing problems  signs and symptoms of a stroke like changes in vision; confusion; trouble speaking or understanding; severe headaches; sudden numbness or weakness of the face, arm or leg; trouble walking; dizziness; loss of balance or coordination  signs and symptoms of serotonin syndrome like irritable; confusion; diarrhea; fast or irregular heartbeat; muscle twitching; stiff muscles; trouble walking; sweating; high fever; seizures; chills; vomiting Side effects that usually do not require medical attention (report to your doctor or health care professional if they continue or are bothersome):  diarrhea  dizziness  drowsiness  dry mouth  headache  nausea, vomiting  pain, tingling, numbness in the hands or feet  stomach pain This list may not describe all possible side effects. Call your doctor for medical advice about side effects. You may report side effects to FDA at 1-800-FDA-1088. Where should I keep my medicine? Keep out of the reach of children. Store at room temperature between 15 and 30 degrees C (59 and 86 degrees F). Protect from light and moisture. Throw away any unused medicine after the expiration date. NOTE: This sheet is a summary. It may not cover all possible information. If you have questions about this  medicine, talk to your doctor, pharmacist, or health care provider.  2020 Elsevier/Gold Standard (2018-03-27 14:58:08) Topiramate tablets What is this medicine? TOPIRAMATE (toe PYRE a mate) is used to treat seizures in adults or children with epilepsy. It is also used for the prevention of migraine headaches. This medicine may be used for other purposes; ask your health care provider or pharmacist if you have questions. COMMON BRAND NAME(S): Topamax, Topiragen What should I tell my health care provider before I take this medicine? They need to know if you have any of these conditions:  bleeding disorders  kidney disease  lung or breathing disease, like asthma  suicidal thoughts, plans, or attempt; a previous suicide attempt by you or a family member  an unusual or allergic reaction to topiramate, other medicines, foods, dyes, or preservatives  pregnant or trying to get pregnant  breast-feeding How should I use this medicine? Take this medicine by mouth with a glass of water. Follow the directions on the prescription label. Do not cut, crush or chew this medicine. Swallow the tablets whole. You can take it with or without food. If it upsets your stomach, take it with food. Take your medicine at regular intervals. Do not take it more often than directed. Do not stop taking except on your doctor's advice. A special MedGuide will be given to you by the pharmacist with each prescription and refill. Be sure to read this information carefully each time. Talk to your pediatrician regarding the use of this medicine in children. While this drug may be prescribed for children as young as 5 years of age for selected conditions, precautions do apply. Overdosage: If you think you have taken too much of this medicine contact a poison control center or emergency room at once. NOTE: This medicine is only for you. Do not share this medicine with others. What if I miss a dose? If you miss a dose, take it  as soon as you can. If your next dose is to be taken in less than 6 hours, then do not take the missed dose. Take the next dose at your regular time. Do not take double or extra doses. What may interact with this medicine? This medicine may interact with the following medications:  acetazolamide  alcohol  antihistamines for allergy, cough, and cold  aspirin and aspirin-like medicines  atropine  birth control pills  certain medicines for anxiety or sleep  certain medicines for bladder problems like oxybutynin, tolterodine  certain medicines for depression like amitriptyline, fluoxetine, sertraline  certain medicines for seizures like carbamazepine, phenobarbital, phenytoin, primidone, valproic acid, zonisamide  certain medicines for stomach problems like dicyclomine, hyoscyamine  certain medicines for travel sickness like scopolamine  certain medicines for Parkinson's disease like benztropine, trihexyphenidyl  certain medicines that treat or prevent blood clots like warfarin, enoxaparin, dalteparin, apixaban, dabigatran, and rivaroxaban  digoxin  general anesthetics like halothane, isoflurane, methoxyflurane, propofol  hydrochlorothiazide  ipratropium  lithium  medicines that relax muscles for surgery  metformin  narcotic medicines for pain  NSAIDs, medicines for pain and inflammation, like ibuprofen or naproxen  phenothiazines like chlorpromazine, mesoridazine, prochlorperazine, thioridazine  pioglitazone This list may not describe all possible interactions. Give your health care provider a list of all the medicines, herbs, non-prescription drugs, or dietary supplements you use. Also tell them if you smoke, drink alcohol, or use illegal drugs. Some items may interact with your medicine. What should I watch for while using this medicine? Visit your doctor or health care professional for regular checks on your progress. Tell your health care professional if your  symptoms do not start to get better or if they get worse. Do not stop taking except on your health care professional's advice. You may develop a severe reaction. Your health care professional will tell you how much medicine to take. Wear a medical ID bracelet or chain. Carry a card that describes your disease and details of your medicine and dosage times. This medicine can reduce the response of your body to heat or cold. Dress warm in cold weather and stay hydrated in hot weather. If possible, avoid extreme temperatures like saunas, hot tubs, very hot or cold showers, or activities that can cause dehydration such as vigorous exercise. Check  with your health care professional if you have severe diarrhea, nausea, and vomiting, or if you sweat a lot. The loss of too much body fluid may make it dangerous for you to take this medicine. You may get drowsy or dizzy. Do not drive, use machinery, or do anything that needs mental alertness until you know how this medicine affects you. Do not stand up or sit up quickly, especially if you are an older patient. This reduces the risk of dizzy or fainting spells. Alcohol may interfere with the effect of this medicine. Avoid alcoholic drinks. Tell your health care professional right away if you have any change in your eyesight. Patients and their families should watch out for new or worsening depression or thoughts of suicide. Also watch out for sudden changes in feelings such as feeling anxious, agitated, panicky, irritable, hostile, aggressive, impulsive, severely restless, overly excited and hyperactive, or not being able to sleep. If this happens, especially at the beginning of treatment or after a change in dose, call your healthcare professional. This medicine may cause serious skin reactions. They can happen weeks to months after starting the medicine. Contact your health care provider right away if you notice fevers or flu-like symptoms with a rash. The rash may be  red or purple and then turn into blisters or peeling of the skin. Or, you might notice a red rash with swelling of the face, lips or lymph nodes in your neck or under your arms. Birth control may not work properly while you are taking this medicine. Talk to your health care professional about using an extra method of birth control. Women should inform their health care professional if they wish to become pregnant or think they might be pregnant. There is a potential for serious side effects and harm to an unborn child. Talk to your health care professional for more information. What side effects may I notice from receiving this medicine? Side effects that you should report to your doctor or health care professional as soon as possible:  allergic reactions like skin rash, itching or hives, swelling of the face, lips, or tongue  blood in the urine  changes in vision  confusion  loss of memory  pain in lower back or side  pain when urinating  redness, blistering, peeling or loosening of the skin, including inside the mouth  signs and symptoms of bleeding such as bloody or black, tarry stools; red or dark brown urine; spitting up blood or brown material that looks like coffee grounds; red spots on the skin; unusual bruising or bleeding from the eyes, gums, or nose  signs and symptoms of increased acid in the body like breathing fast; fast heartbeat; headache; confusion; unusually weak or tired; nausea, vomiting  suicidal thoughts, mood changes  trouble speaking or understanding  unusual sweating  unusually weak or tired Side effects that usually do not require medical attention (report to your doctor or health care professional if they continue or are bothersome):  dizziness  drowsiness  fever  loss of appetite  nausea, vomiting  pain, tingling, numbness in the hands or feet  stomach pain  tiredness  upset stomach This list may not describe all possible side effects.  Call your doctor for medical advice about side effects. You may report side effects to FDA at 1-800-FDA-1088. Where should I keep my medicine? Keep out of the reach of children. Store at room temperature between 15 and 30 degrees C (59 and 86 degrees F). Throw away any  unused medicine after the expiration date. NOTE: This sheet is a summary. It may not cover all possible information. If you have questions about this medicine, talk to your doctor, pharmacist, or health care provider.  2020 [employer]vier/Gold Standard (2019-04-11 15:07:20)

## 2020-03-24 NOTE — Progress Notes (Signed)
[name]  [facility]    Provider:  Dr Lucia Gaskins Requesting Provider: No ref. provider found Primary Care Provider:  Patient, No Pcp Per  CC:  migraines  HPI:  Nicole Francis is a 24 y.o. female here as requested by ED for migraines. Migraines since the age of 36. She tried vitamin b and magnesium and didn't help. Can be the entire top of the head, sometimes the sides of the head, pulsating/pounding/throbbing,nausea,light and sound sensitivity, movement makes it work, skin sensitivity as well. Mom has migraines. Migraines can last 24 hours to several weeks on and off. The last few days she has been very sick because of a migraine and now she is feeling better. She does not take OTC meds, maybe tylenol. No aura. She gets dizzy, lightheaded, cold or hot. Over the last few years they have worsened in frequency and some have been more severe, quality the same, she wakes with headaches, she can have blurry vision, nor positional or exertional. Tylenol may help, unknown triggers, she has a good routine, she sleeps well, eats well, exercise. Stays hydrated. She has a lot of nausea.   Reviewed notes, labs and imaging from outside physicians, which showed:  Cbc nml, cmp unremarkab;e BUN 12 and cr 0.71  MRI of the brain 07/2015   There is no evidence of acute infarct, intracranial hemorrhage, mass, midline shift, or extra-axial fluid collection. Ventricles and sulci are normal. Brain is normal in signal.  Orbits are unremarkable. There is mild bilateral ethmoid, sphenoid, and maxillary sinus mucosal thickening with small fluid levels in the left greater than right maxillary sinuses. Mastoid air cells clear. Major intracranial vascular flow voids are preserved.  IMPRESSION: 1. Unremarkable appearance of the brain. 2. Paranasal sinus mucosal thickening and fluid. Correlate for acute sinusitis.  Review of Systems: Patient complains of symptoms per HPI as well as the following  symptoms: headache. Pertinent negatives and positives per HPI. All others negative.   Social History   Socioeconomic History  . Marital status: Single    Spouse name: Not on file  . Number of children: 0  . Years of education: Not on file  . Highest education level: Not on file  Occupational History  . Not on file  Tobacco Use  . Smoking status: Never Smoker  . Smokeless tobacco: Never Used  Substance and Sexual Activity  . Alcohol use: No    Alcohol/week: 0.0 standard drinks  . Drug use: No  . Sexual activity: Yes    Birth control/protection: Implant  Other Topics Concern  . Not on file  Social History Narrative   [name] graduated from high school. She plans on attending [employer] in the fall and majoring in Psychology.   She lives with her adoptive grandmother and biological, maternal half-brother.    Social Determinants of Health   Financial Resource Strain:   . Difficulty of Paying Living Expenses:   Food Insecurity:   . Worried About Programme researcher, broadcasting/film/video in the Last Year:   . Barista in the Last Year:   Transportation Needs:   . Freight forwarder (Medical):   [name] Lack of Transportation (Non-Medical):   Physical Activity:   . Days of Exercise per Week:   . Minutes of Exercise per Session:   Stress:   . Feeling of Stress :   Social Connections:   . Frequency of Communication with Friends and Family:   . Frequency of Social Gatherings with Friends and Family:   .  Attends Religious Services:   . Active Member of Clubs or Organizations:   . Attends Banker Meetings:   [name] Marital Status:   Intimate Partner Violence:   . Fear of Current or Ex-Partner:   . Emotionally Abused:   [name] Physically Abused:   . Sexually Abused:     Family History  Problem Relation Age of Onset  . Bipolar disorder Mother   . High Cholesterol Mother   . High blood pressure Mother   . Migraines Mother   . Sinusitis Mother   . ADD / ADHD Brother     History  reviewed. No pertinent past medical history.  Patient Active Problem List   Diagnosis Date Noted  . Chronic migraine without aura with status migrainosus, not intractable 01/11/2016  . Tension headache 01/11/2016  . Circadian rhythm sleep disorder, irregular sleep wake type 01/11/2016  . Anxiety state 01/11/2016  . Depression 01/11/2016  . Adjustment disorder with mixed anxiety and depressed mood 08/18/2015  . Insomnia 08/18/2015  . Eczema 10/03/2014  . Surveillance of implantable subdermal contraceptive 10/03/2014    Past Surgical History:  Procedure Laterality Date  . OTHER SURGICAL HISTORY Left    Nexplanon     Current Outpatient Medications  Medication Sig Dispense Refill  . acetaminophen (TYLENOL) 500 MG tablet Take 1,000 mg by mouth every 6 (six) hours as needed for headache.    [name] oxymetazoline (AFRIN) 0.05 % nasal spray Place 1 spray into both nostrils 2 (two) times daily as needed for congestion.    . QUEtiapine (SEROQUEL) 50 MG tablet Take 50 mg by mouth at bedtime.    . sertraline (ZOLOFT) 50 MG tablet Take 50 mg by mouth daily.    . butalbital-acetaminophen-caffeine (FIORICET) 50-325-40 MG tablet Take 1-2 tablets by mouth every 8 (eight) hours as needed for headache. 20 tablet 0  . metoCLOPramide (REGLAN) 10 MG tablet Take 1 tablet (10 mg total) by mouth every 6 (six) hours as needed for nausea or vomiting (nausea/headache). 6 tablet 0  . ondansetron (ZOFRAN-ODT) 4 MG disintegrating tablet Take 1 tablet (4 mg total) by mouth every 8 (eight) hours as needed for nausea. 30 tablet 3  . Rimegepant Sulfate (NURTEC) 75 MG TBDP Take 75 mg by mouth daily as needed. For migraines. Take as close to onset of migraine as possible. One daily maximum. 10 tablet 0  . rizatriptan (MAXALT-MLT) 10 MG disintegrating tablet Take 1 tablet (10 mg total) by mouth as needed for migraine. May repeat in 2 hours if needed 9 tablet 11  . topiramate (TOPAMAX) 50 MG tablet Take 1 tablet (50 mg total) by  mouth at bedtime. 30 tablet 6   No current facility-administered medications for this visit.    Allergies as of 03/24/2020  . (No Known Allergies)    Vitals: BP 114/68   Pulse 88   Ht 5' (1.524 m)   Wt 131 lb 6.4 oz (59.6 kg)   LMP 03/02/2020   BMI 25.66 kg/m  Last Weight:  Wt Readings from Last 1 Encounters:  03/24/20 131 lb 6.4 oz (59.6 kg)   Last Height:   Ht Readings from Last 1 Encounters:  03/24/20 5' (1.524 m)     Physical exam: Exam: Gen: NAD, conversant, well nourised, well groomed                     CV: RRR, no MRG. No Carotid Bruits. No peripheral edema, warm, nontender Eyes: Conjunctivae clear without exudates or  hemorrhage  Neuro: Detailed Neurologic Exam  Speech:    Speech is normal; fluent and spontaneous with normal comprehension.  Cognition:    The patient is oriented to person, place, and time;     recent and remote memory intact;     language fluent;     normal attention, concentration,     fund of knowledge Cranial Nerves:    The pupils are equal, round, and reactive to light. The fundi are normal and spontaneous venous pulsations are present. Visual fields are full to finger confrontation. Extraocular movements are intact. Trigeminal sensation is intact and the muscles of mastication are normal. The face is symmetric. The palate elevates in the midline. Hearing intact. Voice is normal. Shoulder shrug is normal. The tongue has normal motion without fasciculations.   Coordination:    Normal finger to nose and heel to shin. Normal rapid alternating movements.   Gait:    Normal native gait  Motor Observation:    No asymmetry, no atrophy, and no involuntary movements noted. Tone:    Normal muscle tone.    Posture:    Posture is normal. normal erect    Strength:    Strength is V/V in the upper and lower limbs.      Sensation: intact to LT     Reflex Exam:  DTR's:    Deep tendon reflexes in the upper and lower extremities are normal  bilaterally.   Toes:    The toes are downgoing bilaterally.   Clonus:    Clonus is absent.    Assessment/Plan:  Really lovely female who is uninsured with chronic migraines  -Patient has never been placed on a medical management regimen for her migraines, we spent a long time today talking about preventative medications, acute medications, lifestyle factors, hereditary aspects, nonpharmacological treatments for migraines and answered all questions for patient I also provided her with a very nice overview of migraines and migraine management.  -She is uninsured, we printed out the [facility] financial aid information and explained to her I highly recommended that she try to get this completed.We also discussed [email or web address]  - Rizatriptan: Please take one tablet at the onset of your headache. If it does not improve the symptoms please take one additional tablet. Do not take more then 2 tablets in 24hrs. Do not take use more then 2 to 3 times in a week. Topiramate 50mg  at bedtime. Let me know how it is going! In 3-4 weeks email and we may increase it if needed. For Nausea: Ondansetron Rizatriptan: Please take one tablet at the onset of your headache. If it does not improve the symt take use more then 2 to 3 times in a week. For the next 6 days take Nurtec daily and then as needed for migraine (may be used with zofran and the Rizatriptan)  Discussed: To prevent or relieve headaches, try the following: Cool Compress. Lie down and place a cool compress on your head.  Avoid headache triggers. If certain foods or odors seem to have triggered your migraines in the past, avoid them. A headache diary might help you identify triggers.  Include physical activity in your daily routine. Try a daily walk or other moderate aerobic exercise.  Manage stress. Find healthy ways to cope with the stressors, such as delegating tasks on your to-do list.  Practice relaxation techniques. Try deep breathing, yoga, massage and  visualization.  Eat regularly. Eating regularly scheduled meals and maintaining a healthy diet might help prevent headaches. Also,  drink plenty of fluids.  Follow a regular sleep schedule. Sleep deprivation might contribute to headaches Consider biofeedback. With this mind-body technique, you learn to control certain bodily functions -- such as muscle tension, heart rate and blood pressure -- to prevent headaches or reduce headache pain.    Proceed to emergency room if you experience new or worsening symptoms or symptoms do not resolve, if you have new neurologic symptoms or if headache is severe, or for any concerning symptom.   Provided education and documentation from American headache Society toolbox including articles on: chronic migraine medication overuse headache, chronic migraines, prevention of migraines, behavioral and other nonpharmacologic treatments for headache.    No orders of the defined types were placed in this encounter.  Meds ordered this encounter  Medications  . rizatriptan (MAXALT-MLT) 10 MG disintegrating tablet    Sig: Take 1 tablet (10 mg total) by mouth as needed for migraine. May repeat in 2 hours if needed    Dispense:  9 tablet    Refill:  11  . ondansetron (ZOFRAN-ODT) 4 MG disintegrating tablet    Sig: Take 1 tablet (4 mg total) by mouth every 8 (eight) hours as needed for nausea.    Dispense:  30 tablet    Refill:  3  . topiramate (TOPAMAX) 50 MG tablet    Sig: Take 1 tablet (50 mg total) by mouth at bedtime.    Dispense:  30 tablet    Refill:  6  . Rimegepant Sulfate (NURTEC) 75 MG TBDP    Sig: Take 75 mg by mouth daily as needed. For migraines. Take as close to onset of migraine as possible. One daily maximum.    Dispense:  10 tablet    Refill:  0    Cc: No ref. provider found,  Patient, No Pcp Per  Naomie DeanAntonia Ruchel Brandenburger, MD  [facility] [address] [place] [id number]  Phone [phone] Fax  [phone]  I spent 60 minutes of face-to-face and non-face-to-face time with patient on the  1. Chronic migraine without aura with status migrainosus, not intractable    diagnosis.  This included previsit chart review, lab review, study review, order entry, electronic health record documentation, patient education on the different diagnostic and therapeutic options, counseling and coordination of care, risks and benefits of management, compliance, or risk factor reduction

## 2020-06-23 NOTE — Progress Notes (Signed)
[name]  [facility]    Provider:  Dr Lucia Gaskins Requesting Provider: No ref. provider found Primary Care Provider:  Patient, No Pcp Per  CC:  Migraines  Interval history: migraines better but not great. We discussed options. She "forgot" to email me and also "forgot" to seek out [facility] financial assistance. We will increae topiramate.  HPI:  Nicole Francis is a 24 y.o. female here as requested by ED for migraines. Migraines since the age of 72. She tried vitamin b and magnesium and didn't help. Can be the entire top of the head, sometimes the sides of the head, pulsating/pounding/throbbing,nausea,light and sound sensitivity, movement makes it work, skin sensitivity as well. Mom has migraines. Migraines can last 24 hours to several weeks on and off. The last few days she has been very sick because of a migraine and now she is feeling better. She does not take OTC meds, maybe tylenol. No aura. She gets dizzy, lightheaded, cold or hot. Over the last few years they have worsened in frequency and some have been more severe, quality the same, she wakes with headaches, she can have blurry vision, nor positional or exertional. Tylenol may help, unknown triggers, she has a good routine, she sleeps well, eats well, exercise. Stays hydrated. She has a lot of nausea.   Reviewed notes, labs and imaging from outside physicians, which showed:  Cbc nml, cmp unremarkab;e BUN 12 and cr 0.71  MRI of the brain 07/2015   There is no evidence of acute infarct, intracranial hemorrhage, mass, midline shift, or extra-axial fluid collection. Ventricles and sulci are normal. Brain is normal in signal.  Orbits are unremarkable. There is mild bilateral ethmoid, sphenoid, and maxillary sinus mucosal thickening with small fluid levels in the left greater than right maxillary sinuses. Mastoid air cells clear. Major intracranial vascular flow voids are preserved.  IMPRESSION: 1. Unremarkable appearance of  the brain. 2. Paranasal sinus mucosal thickening and fluid. Correlate for acute sinusitis.  Review of Systems: Patient complains of symptoms per HPI as well as the following symptoms: headache. Pertinent negatives and positives per HPI. All others negative.   Social History   Socioeconomic History  . Marital status: Single    Spouse name: Not on file  . Number of children: 0  . Years of education: Not on file  . Highest education level: Not on file  Occupational History  . Not on file  Tobacco Use  . Smoking status: Never Smoker  . Smokeless tobacco: Never Used  Substance and Sexual Activity  . Alcohol use: No    Alcohol/week: 0.0 standard drinks  . Drug use: No  . Sexual activity: Yes    Birth control/protection: Implant  Other Topics Concern  . Not on file  Social History Narrative   Nicole Francis graduated from high school. She plans on attending [employer] in the fall and majoring in Psychology.   She lives with her adoptive grandmother and biological, maternal half-brother.       Caffeine: maybe 1 cup/week   Social Determinants of Health   Financial Resource Strain:   . Difficulty of Paying Living Expenses: Not on file  Food Insecurity:   . Worried About Programme researcher, broadcasting/film/video in the Last Year: Not on file  . Ran Out of Food in the Last Year: Not on file  Transportation Needs:   . Lack of Transportation (Medical): Not on file  . Lack of Transportation (Non-Medical): Not on file  Physical Activity:   . Days  of Exercise per Week: Not on file  . Minutes of Exercise per Session: Not on file  Stress:   . Feeling of Stress : Not on file  Social Connections:   . Frequency of Communication with Friends and Family: Not on file  . Frequency of Social Gatherings with Friends and Family: Not on file  . Attends Religious Services: Not on file  . Active Member of Clubs or Organizations: Not on file  . Attends BankerClub or Organization Meetings: Not on file  . Marital Status: Not on file    Intimate Partner Violence:   . Fear of Current or Ex-Partner: Not on file  . Emotionally Abused: Not on file  . Physically Abused: Not on file  . Sexually Abused: Not on file    Family History  Problem Relation Age of Onset  . Bipolar disorder Mother   . High Cholesterol Mother   . High blood pressure Mother   . Migraines Mother   . Sinusitis Mother   . ADD / ADHD Brother     Past Medical History:  Diagnosis Date  . Migraine     Patient Active Problem List   Diagnosis Date Noted  . Chronic migraine without aura with status migrainosus, not intractable 01/11/2016  . Tension headache 01/11/2016  . Circadian rhythm sleep disorder, irregular sleep wake type 01/11/2016  . Anxiety state 01/11/2016  . Depression 01/11/2016  . Adjustment disorder with mixed anxiety and depressed mood 08/18/2015  . Insomnia 08/18/2015  . Eczema 10/03/2014  . Surveillance of implantable subdermal contraceptive 10/03/2014    Past Surgical History:  Procedure Laterality Date  . OTHER SURGICAL HISTORY Left    Nexplanon     Current Outpatient Medications  Medication Sig Dispense Refill  . acetaminophen (TYLENOL) 500 MG tablet Take 1,000 mg by mouth every 6 (six) hours as needed for headache.    . butalbital-acetaminophen-caffeine (FIORICET) 50-325-40 MG tablet Take 1-2 tablets by mouth every 8 (eight) hours as needed for headache. 20 tablet 0  . metoCLOPramide (REGLAN) 10 MG tablet Take 1 tablet (10 mg total) by mouth every 6 (six) hours as needed for nausea or vomiting (nausea/headache). 6 tablet 0  . ondansetron (ZOFRAN-ODT) 4 MG disintegrating tablet Take 1 tablet (4 mg total) by mouth every 8 (eight) hours as needed for nausea. 30 tablet 3  . oxymetazoline (AFRIN) 0.05 % nasal spray Place 1 spray into both nostrils 2 (two) times daily as needed for congestion.    . QUEtiapine (SEROQUEL) 50 MG tablet Take 50 mg by mouth at bedtime.    . Rimegepant Sulfate (NURTEC) 75 MG TBDP Take 75 mg by  mouth daily as needed. For migraines. Take as close to onset of migraine as possible. One daily maximum. 10 tablet 0  . rizatriptan (MAXALT-MLT) 10 MG disintegrating tablet Take 1 tablet (10 mg total) by mouth as needed for migraine. May repeat in 2 hours if needed 9 tablet 11  . sertraline (ZOLOFT) 50 MG tablet Take 50 mg by mouth daily.    [name]. topiramate (TOPAMAX) 50 MG tablet Stake 2 pills at bedtime (100mg ) for 2 weeks then increase to 3 pills at bedtime (150mg ) 270 tablet 4   No current facility-administered medications for this visit.    Allergies as of 06/24/2020  . (No Known Allergies)    Vitals: BP (!) 93/57 (BP Location: Right Arm, Patient Position: Sitting)   Pulse 75   Ht 5' (1.524 m)   Wt 123 lb (55.8 kg)  BMI 24.02 kg/m  Last Weight:  Wt Readings from Last 1 Encounters:  06/24/20 123 lb (55.8 kg)   Last Height:   Ht Readings from Last 1 Encounters:  06/24/20 5' (1.524 m)    Neuro: Detailed Neurologic Exam  Speech:    Speech is normal; fluent and spontaneous with normal comprehension.  Cognition:    The patient is oriented to person, place, and time;     recent and remote memory intact;     language fluent;     normal attention, concentration,     fund of knowledge Cranial Nerves:    The pupils are equal, round, and reactive to light.  Visual fields are full to finger confrontation. Extraocular movements are intact. Trigeminal sensation is intact and the muscles of mastication are normal. The face is symmetric. The palate elevates in the midline. Hearing intact. Voice is normal. Shoulder shrug is normal. The tongue has normal motion without fasciculations.     Assessment/Plan:  Really lovely female who is uninsured with chronic migraines  - Increase Topamax to 100mg (2 pills) at bedtime then 150mg (3 pills at bedtime) - continue rizatriptan and ondansetron prn (working well)  - - She "forgot" about emailing me to increase Topamax. She also "forget" to pursue  [facility] financial  - Increase slowly to 100mg  and then 150mg   PRIOR:  -Patient has never been placed on a medical management regimen for her migraines, we spent a long time today talking about preventative medications, acute medications, lifestyle factors, hereditary aspects, nonpharmacological treatments for migraines and answered all questions for patient I also provided her with a very nice overview of migraines and migraine management.  -She is uninsured, we printed out the [facility] financial aid information and explained to her I highly recommended that she try to get this completed.We also discussed [email or web address]  - Rizatriptan: Please take one tablet at the onset of your headache. If it does not improve the symptoms please take one additional tablet. Do not take more then 2 tablets in 24hrs. Do not take use more then 2 to 3 times in a week. Topiramate 50mg  at bedtime. Let me know how it is going! In 3-4 weeks email and we may increase it if needed. For Nausea: Ondansetron Rizatriptan: Please take one tablet at the onset of your headache. If it does not improve the symt take use more then 2 to 3 times in a week. For the next 6 days take Nurtec daily and then as needed for migraine (may be used with zofran and the Rizatriptan)  Discussed: To prevent or relieve headaches, try the following: Cool Compress. Lie down and place a cool compress on your head.  Avoid headache triggers. If certain foods or odors seem to have triggered your migraines in the past, avoid them. A headache diary might help you identify triggers.  Include physical activity in your daily routine. Try a daily walk or other moderate aerobic exercise.  Manage stress. Find healthy ways to cope with the stressors, such as delegating tasks on your to-do list.  Practice relaxation techniques. Try deep breathing, yoga, massage and visualization.  Eat regularly. Eating regularly scheduled meals and maintaining a healthy diet might help  prevent headaches. Also, drink plenty of fluids.  Follow a regular sleep schedule. Sleep deprivation might contribute to headaches Consider biofeedback. With this mind-body technique, you learn to control certain bodily functions -- such as muscle tension, heart rate and blood pressure -- to prevent headaches or reduce headache pain.  Proceed to emergency room if you experience new or worsening symptoms or symptoms do not resolve, if you have new neurologic symptoms or if headache is severe, or for any concerning symptom.   Provided education and documentation from American headache Society toolbox including articles on: chronic migraine medication overuse headache, chronic migraines, prevention of migraines, behavioral and other nonpharmacologic treatments for headache.    No orders of the defined types were placed in this encounter.  Meds ordered this encounter  Medications  . topiramate (TOPAMAX) 50 MG tablet    Sig: Stake 2 pills at bedtime (100mg ) for 2 weeks then increase to 3 pills at bedtime (150mg )    Dispense:  270 tablet    Refill:  4    Cc: No ref. provider found,  Patient, No Pcp Per  , MD  [facility] [address] [place], [address] [place]  Phone [phone] Fax [phone]  I spent more than 10 minutes of face-to-face and non-face-to-face time with patient on the  1. Chronic migraine without aura with status migrainosus, not intractable    diagnosis.  This included previsit chart review, lab review, study review, order entry, electronic health record documentation, patient education on the different diagnostic and therapeutic options, counseling and coordination of care, risks and benefits of management, compliance, or risk factor reduction

## 2020-06-24 ENCOUNTER — Ambulatory Visit (INDEPENDENT_AMBULATORY_CARE_PROVIDER_SITE_OTHER): Payer: Self-pay | Admitting: Neurology

## 2020-06-24 ENCOUNTER — Other Ambulatory Visit: Payer: Self-pay

## 2020-06-24 ENCOUNTER — Encounter: Payer: Self-pay | Admitting: Neurology

## 2020-06-24 VITALS — BP 93/57 | HR 75 | Ht 60.0 in | Wt 123.0 lb

## 2020-06-24 DIAGNOSIS — G43701 Chronic migraine without aura, not intractable, with status migrainosus: Secondary | ICD-10-CM

## 2020-06-24 MED ORDER — TOPIRAMATE 50 MG PO TABS: ORAL_TABLET | ORAL | 4 refills | Status: DC

## 2020-06-24 NOTE — Patient Instructions (Signed)
Increase to 100mg (2 pills) at bedtime then 150mg (3 pills at bedtime)  Topiramate tablets What is this medicine? TOPIRAMATE (toe PYRE a mate) is used to treat seizures in adults or children with epilepsy. It is also used for the prevention of migraine headaches. This medicine may be used for other purposes; ask your health care provider or pharmacist if you have questions. COMMON BRAND NAME(S): Topamax, Topiragen What should I tell my health care provider before I take this medicine? They need to know if you have any of these conditions:  bleeding disorders  kidney disease  lung or breathing disease, like asthma  suicidal thoughts, plans, or attempt; a previous suicide attempt by you or a family member  an unusual or allergic reaction to topiramate, other medicines, foods, dyes, or preservatives  pregnant or trying to get pregnant  breast-feeding How should I use this medicine? Take this medicine by mouth with a glass of water. Follow the directions on the prescription label. Do not cut, crush or chew this medicine. Swallow the tablets whole. You can take it with or without food. If it upsets your stomach, take it with food. Take your medicine at regular intervals. Do not take it more often than directed. Do not stop taking except on your doctor's advice. A special MedGuide will be given to you by the pharmacist with each prescription and refill. Be sure to read this information carefully each time. Talk to your pediatrician regarding the use of this medicine in children. While this drug may be prescribed for children as young as 40 years of age for selected conditions, precautions do apply. Overdosage: If you think you have taken too much of this medicine contact a poison control center or emergency room at once. NOTE: This medicine is only for you. Do not share this medicine with others. What if I miss a dose? If you miss a dose, take it as soon as you can. If your next dose is to be  taken in less than 6 hours, then do not take the missed dose. Take the next dose at your regular time. Do not take double or extra doses. What may interact with this medicine? This medicine may interact with the following medications:  acetazolamide  alcohol  antihistamines for allergy, cough, and cold  aspirin and aspirin-like medicines  atropine  birth control pills  certain medicines for anxiety or sleep  certain medicines for bladder problems like oxybutynin, tolterodine  certain medicines for depression like amitriptyline, fluoxetine, sertraline  certain medicines for seizures like carbamazepine, phenobarbital, phenytoin, primidone, valproic acid, zonisamide  certain medicines for stomach problems like dicyclomine, hyoscyamine  certain medicines for travel sickness like scopolamine  certain medicines for Parkinson's disease like benztropine, trihexyphenidyl  certain medicines that treat or prevent blood clots like warfarin, enoxaparin, dalteparin, apixaban, dabigatran, and rivaroxaban  digoxin  general anesthetics like halothane, isoflurane, methoxyflurane, propofol  hydrochlorothiazide  ipratropium  lithium  medicines that relax muscles for surgery  metformin  narcotic medicines for pain  NSAIDs, medicines for pain and inflammation, like ibuprofen or naproxen  phenothiazines like chlorpromazine, mesoridazine, prochlorperazine, thioridazine  pioglitazone This list may not describe all possible interactions. Give your health care provider a list of all the medicines, herbs, non-prescription drugs, or dietary supplements you use. Also tell them if you smoke, drink alcohol, or use illegal drugs. Some items may interact with your medicine. What should I watch for while using this medicine? Visit your doctor or health care professional for regular checks on  your progress. Tell your health care professional if your symptoms do not start to get better or if they  get worse. Do not stop taking except on your health care professional's advice. You may develop a severe reaction. Your health care professional will tell you how much medicine to take. Wear a medical ID bracelet or chain. Carry a card that describes your disease and details of your medicine and dosage times. This medicine can reduce the response of your body to heat or cold. Dress warm in cold weather and stay hydrated in hot weather. If possible, avoid extreme temperatures like saunas, hot tubs, very hot or cold showers, or activities that can cause dehydration such as vigorous exercise. Check with your health care professional if you have severe diarrhea, nausea, and vomiting, or if you sweat a lot. The loss of too much body fluid may make it dangerous for you to take this medicine. You may get drowsy or dizzy. Do not drive, use machinery, or do anything that needs mental alertness until you know how this medicine affects you. Do not stand up or sit up quickly, especially if you are an older patient. This reduces the risk of dizzy or fainting spells. Alcohol may interfere with the effect of this medicine. Avoid alcoholic drinks. Tell your health care professional right away if you have any change in your eyesight. Patients and their families should watch out for new or worsening depression or thoughts of suicide. Also watch out for sudden changes in feelings such as feeling anxious, agitated, panicky, irritable, hostile, aggressive, impulsive, severely restless, overly excited and hyperactive, or not being able to sleep. If this happens, especially at the beginning of treatment or after a change in dose, call your healthcare professional. This medicine may cause serious skin reactions. They can happen weeks to months after starting the medicine. Contact your health care provider right away if you notice fevers or flu-like symptoms with a rash. The rash may be red or purple and then turn into blisters or  peeling of the skin. Or, you might notice a red rash with swelling of the face, lips or lymph nodes in your neck or under your arms. Birth control may not work properly while you are taking this medicine. Talk to your health care professional about using an extra method of birth control. Women should inform their health care professional if they wish to become pregnant or think they might be pregnant. There is a potential for serious side effects and harm to an unborn child. Talk to your health care professional for more information. What side effects may I notice from receiving this medicine? Side effects that you should report to your doctor or health care professional as soon as possible:  allergic reactions like skin rash, itching or hives, swelling of the face, lips, or tongue  blood in the urine  changes in vision  confusion  loss of memory  pain in lower back or side  pain when urinating  redness, blistering, peeling or loosening of the skin, including inside the mouth  signs and symptoms of bleeding such as bloody or black, tarry stools; red or dark brown urine; spitting up blood or brown material that looks like coffee grounds; red spots on the skin; unusual bruising or bleeding from the eyes, gums, or nose  signs and symptoms of increased acid in the body like breathing fast; fast heartbeat; headache; confusion; unusually weak or tired; nausea, vomiting  suicidal thoughts, mood changes  trouble speaking  or understanding  unusual sweating  unusually weak or tired Side effects that usually do not require medical attention (report to your doctor or health care professional if they continue or are bothersome):  dizziness  drowsiness  fever  loss of appetite  nausea, vomiting  pain, tingling, numbness in the hands or feet  stomach pain  tiredness  upset stomach This list may not describe all possible side effects. Call your doctor for medical advice about side  effects. You may report side effects to FDA at 1-800-FDA-1088. Where should I keep my medicine? Keep out of the reach of children. Store at room temperature between 15 and 30 degrees C (59 and 86 degrees F). Throw away any unused medicine after the expiration date. NOTE: This sheet is a summary. It may not cover all possible information. If you have questions about this medicine, talk to your doctor, pharmacist, or health care provider.  2020 Elsevier/Gold Standard (2019-04-11 15:07:20)

## 2020-08-04 ENCOUNTER — Other Ambulatory Visit: Payer: Self-pay | Admitting: Neurology

## 2020-08-11 ENCOUNTER — Telehealth (INDEPENDENT_AMBULATORY_CARE_PROVIDER_SITE_OTHER): Payer: No Payment, Other | Admitting: Psychiatry

## 2020-08-11 ENCOUNTER — Encounter (HOSPITAL_COMMUNITY): Payer: Self-pay

## 2020-08-11 ENCOUNTER — Other Ambulatory Visit: Payer: Self-pay

## 2020-08-11 DIAGNOSIS — F411 Generalized anxiety disorder: Secondary | ICD-10-CM

## 2020-08-11 MED ORDER — GABAPENTIN 100 MG PO CAPS: 100.0000 mg | ORAL_CAPSULE | Freq: Three times a day (TID) | ORAL | 2 refills | Status: DC
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
## 2020-08-25 ENCOUNTER — Other Ambulatory Visit: Payer: Self-pay

## 2020-08-25 ENCOUNTER — Encounter (HOSPITAL_COMMUNITY): Payer: Self-pay

## 2020-08-25 ENCOUNTER — Telehealth (INDEPENDENT_AMBULATORY_CARE_PROVIDER_SITE_OTHER): Payer: No Payment, Other | Admitting: Psychiatry

## 2020-08-25 DIAGNOSIS — F411 Generalized anxiety disorder: Secondary | ICD-10-CM | POA: Diagnosis not present

## 2020-08-25 MED ORDER — GABAPENTIN 100 MG PO CAPS: 200.0000 mg | ORAL_CAPSULE | Freq: Three times a day (TID) | ORAL | 2 refills | Status: DC
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
## 2020-10-13 ENCOUNTER — Telehealth (INDEPENDENT_AMBULATORY_CARE_PROVIDER_SITE_OTHER): Payer: Self-pay | Admitting: Adult Health

## 2020-10-13 DIAGNOSIS — G43701 Chronic migraine without aura, not intractable, with status migrainosus: Secondary | ICD-10-CM

## 2020-10-13 NOTE — Progress Notes (Addendum)
PATIENT: Nicole Francis DOB: 12-22-1995  REASON FOR VISIT: follow up HISTORY FROM: patient  Virtual Visit via Video Note  I connected with Nicole Francis on 10/13/20 at  1:30 PM EST by a video enabled telemedicine application located remotely at [facility] and verified that I am speaking with the correct person using two identifiers who was located at their own home.   I discussed the limitations of evaluation and management by telemedicine and the availability of in person appointments. The patient expressed understanding and agreed to proceed.   PATIENT: Nicole Francis DOB: 1996/01/12  REASON FOR VISIT: follow up HISTORY FROM: patient  HISTORY OF PRESENT ILLNESS: Today 10/13/20:  Nicole Francis is a 25 year old female with a history of migraine headaches.  She reports that she continues to get 1-2 headaches a week.  She reports that some of these headaches are dull headaches for which she just takes Tylenol.  There are others that are more severe and she does use the rizatriptan.  She states that typically when she takes the rizatriptan her headache will resolve in 1 hour but then it may come back.  At the last visit she was given samples of Nurtec but she "does not know what she did with them."  It seems that she never tried this medication.  The patient continues on Topamax 150 mg at bedtime.  She recently was put on gabapentin 100 mg 3 times a day for anxiety and bipolar per the patient.  She returns today for an evaluation.  HISTORY Interval history: migraines better but not great. We discussed options. She "forgot" to email me and also "forgot" to seek out cone financial assistance. We will increae topiramate.  HPI:  Nicole Francis is a 25 y.o. female here as requested by ED for migraines. Migraines since the age of 70. She tried vitamin b and magnesium and didn't help. Can be the entire top of the head, sometimes the sides of the head,  pulsating/pounding/throbbing,nausea,light and sound sensitivity, movement makes it work, skin sensitivity as well. Mom has migraines. Migraines can last 24 hours to several weeks on and off. The last few days she has been very sick because of a migraine and now she is feeling better. She does not take OTC meds, maybe tylenol. No aura. She gets dizzy, lightheaded, cold or hot. Over the last few years they have worsened in frequency and some have been more severe, quality the same, she wakes with headaches, she can have blurry vision, nor positional or exertional. Tylenol may help, unknown triggers, she has a good routine, she sleeps well, eats well, exercise. Stays hydrated. She has a lot of nausea.   Reviewed notes, labs and imaging from outside physicians, which showed:  Cbc nml, cmp unremarkab;e BUN 12 and cr 0.71  MRI of the brain 07/2015   There is no evidence of acute infarct, intracranial hemorrhage, mass, midline shift, or extra-axial fluid collection. Ventricles and sulci are normal. Brain is normal in signal.  Orbits are unremarkable. There is mild bilateral ethmoid, sphenoid, and maxillary sinus mucosal thickening with small fluid levels in the left greater than right maxillary sinuses. Mastoid air cells clear. Major intracranial vascular flow voids are preserved.  IMPRESSION: 1. Unremarkable appearance of the brain. 2. Paranasal sinus mucosal thickening and fluid. Correlate for acute sinusitis.  REVIEW OF SYSTEMS: Out of a complete 14 system review of symptoms, the patient complains only of the following symptoms, and all other  reviewed systems are negative.  ALLERGIES: No Known Allergies  HOME MEDICATIONS: Outpatient Medications Prior to Visit  Medication Sig Dispense Refill  . gabapentin (NEURONTIN) 100 MG capsule Take 2 capsules (200 mg total) by mouth 3 (three) times daily. 180 capsule 2  . ondansetron (ZOFRAN-ODT) 4 MG disintegrating tablet DISSOLVE ONE TABLET BY  MOUTH EVERY 8 HOURS AS NEEDED FOR NAUSEA 30 tablet 3  . oxymetazoline (AFRIN) 0.05 % nasal spray Place 1 spray into both nostrils 2 (two) times daily as needed for congestion.    . rizatriptan (MAXALT-MLT) 10 MG disintegrating tablet Take 1 tablet (10 mg total) by mouth as needed for migraine. May repeat in 2 hours if needed 9 tablet 11  . topiramate (TOPAMAX) 50 MG tablet Stake 2 pills at bedtime (100mg ) for 2 weeks then increase to 3 pills at bedtime (150mg ) 270 tablet 4   No facility-administered medications prior to visit.    PAST MEDICAL HISTORY: Past Medical History:  Diagnosis Date  . Migraine     PAST SURGICAL HISTORY: Past Surgical History:  Procedure Laterality Date  . OTHER SURGICAL HISTORY Left    Nexplanon     FAMILY HISTORY: Family History  Problem Relation Age of Onset  . Bipolar disorder Mother   . High Cholesterol Mother   . High blood pressure Mother   . Migraines Mother   . Sinusitis Mother   . ADD / ADHD Brother     SOCIAL HISTORY: Social History   Socioeconomic History  . Marital status: Single    Spouse name: Not on file  . Number of children: 0  . Years of education: Not on file  . Highest education level: Not on file  Occupational History  . Not on file  Tobacco Use  . Smoking status: Never Smoker  . Smokeless tobacco: Never Used  . Tobacco comment: vapes nicotine  Substance and Sexual Activity  . Alcohol use: No    Alcohol/week: 0.0 standard drinks  . Drug use: No  . Sexual activity: Yes    Birth control/protection: Implant  Other Topics Concern  . Not on file  Social History Narrative   Nicole Francis graduated from high school. She plans on attending [employer] in the fall and majoring in Psychology.   She lives with her adoptive grandmother and biological, maternal half-brother.       Caffeine: maybe 1 cup/week   Social Determinants of Health   Financial Resource Strain: Not on file  Food Insecurity: Not on file  Transportation Needs:  Not on file  Physical Activity: Not on file  Stress: Not on file  Social Connections: Not on file  Intimate Partner Violence: Not on file      PHYSICAL EXAM Generalized: Well developed, in no acute distress   Neurological examination  Mentation: Alert oriented to time, place, history taking. Follows all commands speech and language fluent Cranial nerve II-XII:Extraocular movements were full. Facial symmetry noted. uvula tongue midline. Head turning and shoulder shrug  were normal and symmetric. Motor: Good strength throughout subjectively per patient Sensory: Sensory testing is intact to soft touch on all 4 extremities subjectively per patient Coordination: Cerebellar testing reveals good finger-nose-finger  Gait and station: Patient is able to stand from a seated position. gait is normal.  Reflexes: UTA  DIAGNOSTIC DATA (LABS, IMAGING, TESTING) - I reviewed patient records, labs, notes, testing and imaging myself where available.  Lab Results  Component Value Date   WBC 10.3 03/14/2020   HGB 13.2 03/14/2020  HCT 41.2 03/14/2020   MCV 96.3 03/14/2020   PLT 292 03/14/2020      Component Value Date/Time   NA 135 03/14/2020 2214   K 3.6 03/14/2020 2214   CL 101 03/14/2020 2214   CO2 21 (L) 03/14/2020 2214   GLUCOSE 108 (H) 03/14/2020 2214   BUN 12 03/14/2020 2214   CREATININE 0.71 03/14/2020 2214   CALCIUM 9.5 03/14/2020 2214   PROT 8.2 (H) 03/14/2020 2214   ALBUMIN 4.8 03/14/2020 2214   AST 20 03/14/2020 2214   ALT 32 03/14/2020 2214   ALKPHOS 57 03/14/2020 2214   BILITOT 1.7 (H) 03/14/2020 2214   GFRNONAA >60 03/14/2020 2214   GFRAA >60 03/14/2020 2214       ASSESSMENT AND PLAN 25 y.o. year old female  has a past medical history of Migraine. here with:  1.  Migraine headache  --I suggested that we increase the gabapentin to 100 mg in the morning and at noon and 200 mg at bedtime the patient has an appointment with Ms. York NP on Monday.  She would like to  discuss this with her first.  I advised that I will also send a message through epic. -- She will continue on rizatriptan for now.  Advised that she can take Tylenol with the rizatriptan this may be helpful with keeping the headache from reoccurring. -- Advised if symptoms worsen or she develops new symptoms she should let us know -- Follow-up in office in 3 months.   I spent 25 minutes of face-to-face and non-face-to-face time with patient.  This included previsit chart review, lab review, study review, order entry, electronic health record documentation, patient education.    Butch Penny, MSN, NP-C 10/13/2020, 2:05 PM [facility] [address] [place], [place] [address] [phone]  Made any corrections needed, and agree with history, physical, neuro exam,assessment and plan as stated.     Naomie Dean, MD [facility]

## 2020-10-19 ENCOUNTER — Telehealth (HOSPITAL_COMMUNITY): Payer: No Payment, Other

## 2020-10-26 ENCOUNTER — Telehealth (HOSPITAL_COMMUNITY): Payer: No Payment, Other

## 2020-11-11 ENCOUNTER — Other Ambulatory Visit (HOSPITAL_COMMUNITY): Payer: Self-pay | Admitting: Psychiatry

## 2020-11-16 ENCOUNTER — Other Ambulatory Visit: Payer: Self-pay

## 2020-11-16 ENCOUNTER — Telehealth (INDEPENDENT_AMBULATORY_CARE_PROVIDER_SITE_OTHER): Payer: No Payment, Other | Admitting: Psychiatry

## 2020-11-16 DIAGNOSIS — F330 Major depressive disorder, recurrent, mild: Secondary | ICD-10-CM | POA: Diagnosis not present

## 2020-11-16 MED ORDER — GABAPENTIN 100 MG PO CAPS
200.0000 mg | ORAL_CAPSULE | Freq: Three times a day (TID) | ORAL | 2 refills | Status: DC
Start: 2020-11-16 — End: 2020-12-28
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
## 2020-11-17 ENCOUNTER — Encounter (HOSPITAL_COMMUNITY): Payer: Self-pay

## 2020-12-28 ENCOUNTER — Other Ambulatory Visit: Payer: Self-pay

## 2020-12-28 ENCOUNTER — Encounter (HOSPITAL_COMMUNITY): Payer: Self-pay

## 2020-12-28 ENCOUNTER — Telehealth (INDEPENDENT_AMBULATORY_CARE_PROVIDER_SITE_OTHER): Payer: No Payment, Other | Admitting: Psychiatry

## 2020-12-28 DIAGNOSIS — F411 Generalized anxiety disorder: Secondary | ICD-10-CM

## 2020-12-28 MED ORDER — GABAPENTIN 100 MG PO CAPS: 200.0000 mg | ORAL_CAPSULE | Freq: Three times a day (TID) | ORAL | 2 refills | Status: DC
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
## 2021-01-14 ENCOUNTER — Ambulatory Visit: Payer: Self-pay | Admitting: Adult Health

## 2021-01-25 ENCOUNTER — Ambulatory Visit: Payer: Self-pay | Admitting: Adult Health

## 2021-01-27 ENCOUNTER — Encounter: Payer: Self-pay | Admitting: Family Medicine

## 2021-01-27 ENCOUNTER — Ambulatory Visit (INDEPENDENT_AMBULATORY_CARE_PROVIDER_SITE_OTHER): Payer: Self-pay | Admitting: Family Medicine

## 2021-01-27 ENCOUNTER — Other Ambulatory Visit: Payer: Self-pay

## 2021-01-27 ENCOUNTER — Ambulatory Visit: Payer: Self-pay | Admitting: Adult Health

## 2021-01-27 VITALS — BP 103/66 | HR 84 | Ht 60.0 in | Wt 100.0 lb

## 2021-01-27 DIAGNOSIS — G43701 Chronic migraine without aura, not intractable, with status migrainosus: Secondary | ICD-10-CM

## 2021-01-27 DIAGNOSIS — F419 Anxiety disorder, unspecified: Secondary | ICD-10-CM

## 2021-01-27 MED ORDER — ONDANSETRON 4 MG PO TBDP: ORAL_TABLET | ORAL | 3 refills | Status: DC

## 2021-01-27 MED ORDER — AJOVY 225 MG/1.5ML ~~LOC~~ SOAJ
225.0000 mg | SUBCUTANEOUS | 0 refills | Status: DC
Start: 2021-01-27 — End: 2021-10-11

## 2021-01-27 NOTE — Progress Notes (Addendum)
Chief Complaint  Patient presents with  . Follow-up    RM 2 alone Pt is well, migraines are better but not great. Has about 2 a week that's mild-sever. Causes her to not be able to  hold food. Has lost over 20 lbs      HISTORY OF PRESENT ILLNESS: 01/27/21 ALL:  [name] is a 25 y.o. female here today for follow up for migraines. She continues topiramate 150mg  at bedtime. She has continued rizatriptan with Tylenol that seems to help some. She is unsure if Nurtec helped. Gabapentin was increased to 200mg  TID by psychiatry. She feels this has helped with mood but not much with headaches. She continues to have 2-3 headaches a week, most are migrainous. She is taking rizatriptan at least 2 times a month. She has not noted any specific side effects with taking topiramate but reports losing about 20 pounds. Weight loss confirmed with review of chart. Unclear over what period of time. She reports vomiting "everything" she eats once a week. Most of these days she has a headache. There have been a few episodes where she vomits and does not have a headache. She is using ondansetron but does not usually repeat dose. She reports submitting paperwork for patient assistance. She can not tell me when. She has not followed up.    HISTORY (copied from previous note)  10/13/20:  Nicole Francis is a 25 year old female with a history of migraine headaches.  She reports that she continues to get 1-2 headaches a week.  She reports that some of these headaches are dull headaches for which she just takes Tylenol.  There are others that are more severe and she does use the rizatriptan.  She states that typically when she takes the rizatriptan her headache will resolve in 1 hour but then it may come back.  At the last visit she was given samples of Nurtec but she "does not know what she did with them."  It seems that she never tried this medication.  The patient continues on Topamax 150 mg at  bedtime.  She recently was put on gabapentin 100 mg 3 times a day for anxiety and bipolar per the patient.  She returns today for an evaluation.  HISTORY Interval history: migraines better but not great. We discussed options. She "forgot" to email me and also "forgot" to seek out [facility] financial assistance. We will increae topiramate.  Nicole Noe K Goodingis a 25 y.o.femalehere as requested by ED for migraines. Migraines since the age of 88. She tried vitamin b and magnesium and didn't help. Can be the entire top of the head, sometimes the sides of the head, pulsating/pounding/throbbing,nausea,light and sound sensitivity, movement makes it work, skin sensitivity as well. Mom has migraines. Migraines can last 24 hours to several weeks on and off. The last few days she has been very sick because of a migraine and now she is feeling better. She does not take OTC meds, maybe tylenol. No aura. She gets dizzy, lightheaded, cold or hot. Over the last few years they have worsened in frequency and some have been more severe, quality the same, she wakes with headaches, she can have blurry vision, nor positional or exertional. Tylenol may help, unknown triggers, she has a good routine, she sleeps well, eats well, exercise. Stays hydrated. She has a lot of nausea.   Reviewed notes, labs and imaging from outside physicians, which showed:  Cbc nml, cmp unremarkab;e BUN 12 and cr 0.71  MRI of the brain 07/2015   There is no evidence of acute infarct, intracranial hemorrhage, mass, midline shift, or extra-axial fluid collection. Ventricles and sulci are normal. Brain is normal in signal.  Orbits are unremarkable. There is mild bilateral ethmoid, sphenoid, and maxillary sinus mucosal thickening with small fluid levels in the left greater than right maxillary sinuses. Mastoid air cells clear. Major intracranial vascular flow voids are preserved.  IMPRESSION: 1. Unremarkable appearance of the  brain. 2. Paranasal sinus mucosal thickening and fluid. Correlate for acute sinusitis.     REVIEW OF SYSTEMS: Out of a complete 14 system review of symptoms, the patient complains only of the following symptoms, headaches, anxiety, vomiting, and all other reviewed systems are negative.    ALLERGIES: No Known Allergies   HOME MEDICATIONS: Outpatient Medications Prior to Visit  Medication Sig Dispense Refill  . gabapentin (NEURONTIN) 100 MG capsule Take 2 capsules (200 mg total) by mouth 3 (three) times daily. Take one to two capsules three times daily PRN anxiety 180 capsule 2  . oxymetazoline (AFRIN) 0.05 % nasal spray Place 1 spray into both nostrils 2 (two) times daily as needed for congestion.    . rizatriptan (MAXALT-MLT) 10 MG disintegrating tablet Take 1 tablet (10 mg total) by mouth as needed for migraine. May repeat in 2 hours if needed 9 tablet 11  . topiramate (TOPAMAX) 50 MG tablet Stake 2 pills at bedtime (100mg ) for 2 weeks then increase to 3 pills at bedtime (150mg ) 270 tablet 4  . ondansetron (ZOFRAN-ODT) 4 MG disintegrating tablet DISSOLVE ONE TABLET BY MOUTH EVERY 8 HOURS AS NEEDED FOR NAUSEA 30 tablet 3   No facility-administered medications prior to visit.     PAST MEDICAL HISTORY: Past Medical History:  Diagnosis Date  . Migraine      PAST SURGICAL HISTORY: Past Surgical History:  Procedure Laterality Date  . OTHER SURGICAL HISTORY Left    Nexplanon      FAMILY HISTORY: Family History  Problem Relation Age of Onset  . Bipolar disorder Mother   . High Cholesterol Mother   . High blood pressure Mother   . Migraines Mother   . Sinusitis Mother   . ADD / ADHD Brother      SOCIAL HISTORY: Social History   Socioeconomic History  . Marital status: Single    Spouse name: Not on file  . Number of children: 0  . Years of education: Not on file  . Highest education level: Not on file  Occupational History  . Not on file  Tobacco Use  .  Smoking status: Never Smoker  . Smokeless tobacco: Never Used  . Tobacco comment: vapes nicotine  Substance and Sexual Activity  . Alcohol use: No    Alcohol/week: 0.0 standard drinks  . Drug use: No  . Sexual activity: Yes    Birth control/protection: Implant  Other Topics Concern  . Not on file  Social History Narrative   Nicole Francis graduated from high school. She plans on attending [employer] in the fall and majoring in Psychology.   She lives with her adoptive grandmother and biological, maternal half-brother.       Caffeine: maybe 1 cup/week   Social Determinants of Health   Financial Resource Strain: Not on file  Food Insecurity: Not on file  Transportation Needs: Not on file  Physical Activity: Not on file  Stress: Not on file  Social Connections: Not on file  Intimate Partner Violence: Not on file  PHYSICAL EXAM  Vitals:   01/27/21 1302  BP: 103/66  Pulse: 84  Weight: 100 lb (45.4 kg)  Height: 5' (1.524 m)   Body mass index is 19.53 kg/m.   Generalized: Well developed, in no acute distress  Cardiology: normal rate and rhythm, no murmur auscultated  Respiratory: clear to auscultation bilaterally    Neurological examination  Mentation: Alert oriented to time, place, history taking. Follows all commands speech and language fluent Cranial nerve II-XII: Pupils were equal round reactive to light. Extraocular movements were full, visual field were full on confrontational test. Facial sensation and strength were normal. Head turning and shoulder shrug  were normal and symmetric. Motor: The motor testing reveals 5 over 5 strength of all 4 extremities. Good symmetric motor tone is noted throughout.  Gait and station: Gait is normal.     DIAGNOSTIC DATA (LABS, IMAGING, TESTING) - I reviewed patient records, labs, notes, testing and imaging myself where available.  Lab Results  Component Value Date   WBC 10.3 03/14/2020   HGB 13.2 03/14/2020   HCT 41.2 03/14/2020    MCV 96.3 03/14/2020   PLT 292 03/14/2020      Component Value Date/Time   NA 135 03/14/2020 2214   K 3.6 03/14/2020 2214   CL 101 03/14/2020 2214   CO2 21 (L) 03/14/2020 2214   GLUCOSE 108 (H) 03/14/2020 2214   BUN 12 03/14/2020 2214   CREATININE 0.71 03/14/2020 2214   CALCIUM 9.5 03/14/2020 2214   PROT 8.2 (H) 03/14/2020 2214   ALBUMIN 4.8 03/14/2020 2214   AST 20 03/14/2020 2214   ALT 32 03/14/2020 2214   ALKPHOS 57 03/14/2020 2214   BILITOT 1.7 (H) 03/14/2020 2214   GFRNONAA >60 03/14/2020 2214   GFRAA >60 03/14/2020 2214   No results found for: CHOL, HDL, LDLCALC, LDLDIRECT, TRIG, CHOLHDL No results found for: XTKW4O No results found for: VITAMINB12 No results found for: TSH  No flowsheet data found.   No flowsheet data found.   ASSESSMENT AND PLAN  25 y.o. year old female  has a past medical history of Migraine. here with     Chronic migraine without aura with status migrainosus, not intractable  Anxiety  Ambre continues to have about 8-10 migraine days a month. She feels that topiramate has helped and is tolerating well, however, she has lost about 20 pounds. It is unclear over what period of time. She reports vomiting "all day" at least once a week. Most of these episodes are associated with migraine headaches. I will have her continue topiramate 150mg  daily for now but we will monitor her weight very closely. She will continue rizatriptan for abortive therapy. I will also have her take 4-8mg  of ondansetron every 8 hours on days where she is vomiting. Advised against regular use, otherwise. I am concerned about adding propranolol due to low blood pressure readings and do not want to add tricyclic antidepressants at this time. May consider talking with psychiatry if she is unable to find insurance coverage. I will start Ajovy injections every 30 days. I feel there will be less side effects with CGRP. First injection given in the office. She will administer the  next two samples every 30 days. She will call [employer] to inquire about patient assistance. I will also have her call [employer] to inquire about any possible assistance. She will continue close follow up with psychiatry. She will return to see me or (seen by me in her absence) in 3  months. She verbalizes understanding and agreement with this plan.    No orders of the defined types were placed in this encounter.    Meds ordered this encounter  Medications  . ondansetron (ZOFRAN-ODT) 4 MG disintegrating tablet    Sig: May take 4-8mg  every 8 hours for nausea    Dispense:  30 tablet    Refill:  3    Order Specific Question:   Supervising Provider    Answer:   Anson FretAHERN, ANTONIA B [id number]  . Fremanezumab-vfrm (AJOVY) 225 MG/1.5ML SOAJ    Sig: Inject 225 mg into the skin every 30 (thirty) days.    Dispense:  4.5 mL    Refill:  0    Order Specific Question:   Supervising Provider    Answer:   Anson FretHERN, ANTONIA B [[id number]]       Shawnie DapperAmy Aleynah Rocchio, MSN, FNP-C 01/27/2021, 1:55 PM  [facility] [address], [address] [place], [place] [address] [phone]   agree with assessment and plan as stated.     Naomie DeanAntonia Ahern, MD [facility]

## 2021-01-27 NOTE — Patient Instructions (Addendum)
Below is our plan:  We will continue topiramate for now. I need you to monitor your weight very closely. We may need to decrease dose if continued weight loss occurs. We will add Ajovy injections for migraine prevention. Please take one injection every 30 days. I have provided three samples. Please call to follow up regarding patient assistance as I may not be able to continue injections if you do not have coverage. Go to [email or web address] to see if you may be able to get patient assistance through the pharmaceutical company.   Please make sure you are staying well hydrated. I recommend 50-60 ounces daily. Well balanced diet and regular exercise encouraged. Consistent sleep schedule with 6-8 hours recommended.   Please continue follow up with care team as directed.                   Follow up in 3 months   You may receive a survey regarding today's visit. I encourage you to leave honest feed back as I do use this information to improve patient care. Thank you for seeing me today!      Migraine Headache A migraine headache is a very strong throbbing pain on one side or both sides of your head. This type of headache can also cause other symptoms. It can last from 4 hours to 3 days. Talk with your doctor about what things may bring on (trigger) this condition. What are the causes? The exact cause of this condition is not known. This condition may be triggered or caused by:  Drinking alcohol.  Smoking.  Taking medicines, such as: ? Medicine used to treat chest pain (nitroglycerin). ? Birth control pills. ? Estrogen. ? Some blood pressure medicines.  Eating or drinking certain products.  Doing physical activity. Other things that may trigger a migraine headache include:  Having a menstrual period.  Pregnancy.  Hunger.  Stress.  Not getting enough sleep or getting too much sleep.  Weather changes.  Tiredness (fatigue). What increases the risk?  Being 60-20 years old.  Being  female.  Having a family history of migraine headaches.  Being Caucasian.  Having depression or anxiety.  Being very overweight. What are the signs or symptoms?  A throbbing pain. This pain may: ? Happen in any area of the head, such as on one side or both sides. ? Make it hard to do daily activities. ? Get worse with physical activity. ? Get worse around bright lights or loud noises.  Other symptoms may include: ? Feeling sick to your stomach (nauseous). ? Vomiting. ? Dizziness. ? Being sensitive to bright lights, loud noises, or smells.  Before you get a migraine headache, you may get warning signs (an aura). An aura may include: ? Seeing flashing lights or having blind spots. ? Seeing bright spots, halos, or zigzag lines. ? Having tunnel vision or blurred vision. ? Having numbness or a tingling feeling. ? Having trouble talking. ? Having weak muscles.  Some people have symptoms after a migraine headache (postdromal phase), such as: ? Tiredness. ? Trouble thinking (concentrating). How is this treated?  Taking medicines that: ? Relieve pain. ? Relieve the feeling of being sick to your stomach. ? Prevent migraine headaches.  Treatment may also include: ? Having acupuncture. ? Avoiding foods that bring on migraine headaches. ? Learning ways to control your body functions (biofeedback). ? Therapy to help you know and deal with negative thoughts (cognitive behavioral therapy). Follow these instructions at home: Medicines  Take over-the-counter  and prescription medicines only as told by your doctor.  Ask your doctor if the medicine prescribed to you: ? Requires you to avoid driving or using heavy machinery. ? Can cause trouble pooping (constipation). You may need to take these steps to prevent or treat trouble pooping:  Drink enough fluid to keep your pee (urine) pale yellow.  Take over-the-counter or prescription medicines.  Eat foods that are high in fiber.  These include beans, whole grains, and fresh fruits and vegetables.  Limit foods that are high in fat and sugar. These include fried or sweet foods. Lifestyle  Do not drink alcohol.  Do not use any products that contain nicotine or tobacco, such as cigarettes, e-cigarettes, and chewing tobacco. If you need help quitting, ask your doctor.  Get at least 8 hours of sleep every night.  Limit and deal with stress. General instructions  Keep a journal to find out what may bring on your migraine headaches. For example, write down: ? What you eat and drink. ? How much sleep you get. ? Any change in what you eat or drink. ? Any change in your medicines.  If you have a migraine headache: ? Avoid things that make your symptoms worse, such as bright lights. ? It may help to lie down in a dark, quiet room. ? Do not drive or use heavy machinery. ? Ask your doctor what activities are safe for you.  Keep all follow-up visits as told by your doctor. This is important.      Contact a doctor if:  You get a migraine headache that is different or worse than others you have had.  You have more than 15 headache days in one month. Get help right away if:  Your migraine headache gets very bad.  Your migraine headache lasts longer than 72 hours.  You have a fever.  You have a stiff neck.  You have trouble seeing.  Your muscles feel weak or like you cannot control them.  You start to lose your balance a lot.  You start to have trouble walking.  You pass out (faint).  You have a seizure. Summary  A migraine headache is a very strong throbbing pain on one side or both sides of your head. These headaches can also cause other symptoms.  This condition may be treated with medicines and changes to your lifestyle.  Keep a journal to find out what may bring on your migraine headaches.  Contact a doctor if you get a migraine headache that is different or worse than others you have  had.  Contact your doctor if you have more than 15 headache days in a month. This information is not intended to replace advice given to you by your health care provider. Make sure you discuss any questions you have with your health care provider. Document Revised: 01/04/2019 Document Reviewed: 10/25/2018 Elsevier Patient Education  2021 [employer].

## 2021-03-22 ENCOUNTER — Other Ambulatory Visit: Payer: Self-pay

## 2021-03-22 ENCOUNTER — Telehealth (INDEPENDENT_AMBULATORY_CARE_PROVIDER_SITE_OTHER): Payer: No Payment, Other | Admitting: Psychiatry

## 2021-03-22 ENCOUNTER — Encounter (HOSPITAL_COMMUNITY): Payer: Self-pay

## 2021-03-22 DIAGNOSIS — F411 Generalized anxiety disorder: Secondary | ICD-10-CM

## 2021-03-22 MED ORDER — HYDROXYZINE HCL 25 MG PO TABS: 25.0000 mg | ORAL_TABLET | Freq: Every day | ORAL | 2 refills | Status: AC

## 2021-03-22 MED ORDER — GABAPENTIN 100 MG PO CAPS
200.0000 mg | ORAL_CAPSULE | Freq: Three times a day (TID) | ORAL | 2 refills | Status: DC
Start: 2021-03-22 — End: 2021-05-03
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
## 2021-04-12 ENCOUNTER — Other Ambulatory Visit: Payer: Self-pay | Admitting: Neurology

## 2021-04-29 ENCOUNTER — Encounter: Payer: Self-pay | Admitting: Family Medicine

## 2021-04-29 ENCOUNTER — Ambulatory Visit: Payer: Self-pay | Admitting: Family Medicine

## 2021-04-29 NOTE — Progress Notes (Deleted)
No chief complaint on file.    HISTORY OF PRESENT ILLNESS: 04/29/21 ALL:  Shalana returns for follow up for migraines. We continued topiramate 150mg  daily and started Ajovy injections at last follow up 01/2021. Rizatriptan continued for abortive care and ondansetron added for complaints of vomiting with migraines. Since, . She continues to see psychiatry.   01/27/2021 ALL:  KEANU FRICKEY is a 25 y.o. female here today for follow up for migraines. She continues topiramate 150mg  at bedtime. She has continued rizatriptan with Tylenol that seems to help some. She is unsure if Nurtec helped. Gabapentin was increased to 200mg  TID by psychiatry. She feels this has helped with mood but not much with headaches. She continues to have 2-3 headaches a week, most are migrainous. She is taking rizatriptan at least 2 times a month. She has not noted any specific side effects with taking topiramate but reports losing about 20 pounds. Weight loss confirmed with review of chart. Unclear over what period of time. She reports vomiting "everything" she eats once a week. Most of these days she has a headache. There have been a few episodes where she vomits and does not have a headache. She is using ondansetron but does not usually repeat dose. She reports submitting paperwork for patient assistance. She can not tell me when. She has not followed up.    HISTORY (copied from 30 previous note)  10/13/20:   Ms. Halbleib is a 25 year old female with a history of migraine headaches.  She reports that she continues to get 1-2 headaches a week.  She reports that some of these headaches are dull headaches for which she just takes Tylenol.  There are others that are more severe and she does use the rizatriptan.  She states that typically when she takes the rizatriptan her headache will resolve in 1 hour but then it may come back.  At the last visit she was given samples of Nurtec but she "does not know what she  did with them."  It seems that she never tried this medication.  The patient continues on Topamax 150 mg at bedtime.  She recently was put on gabapentin 100 mg 3 times a day for anxiety and bipolar per the patient.  She returns today for an evaluation.   HISTORY Interval history: migraines better but not great. We discussed options. She "forgot" to email me and also "forgot" to seek out [facility] financial assistance. We will increae topiramate.   HPI:  NYAH SHEPHERD is a 25 y.o. female here as requested by ED for migraines. Migraines since the age of 64. She tried vitamin b and magnesium and didn't help. Can be the entire top of the head, sometimes the sides of the head, pulsating/pounding/throbbing,nausea,light and sound sensitivity, movement makes it work, skin sensitivity as well. Mom has migraines. Migraines can last 24 hours to several weeks on and off. The last few days she has been very sick because of a migraine and now she is feeling better. She does not take OTC meds, maybe tylenol. No aura. She gets dizzy, lightheaded, cold or hot. Over the last few years they have worsened in frequency and some have been more severe, quality the same, she wakes with headaches, she can have blurry vision, nor positional or exertional. Tylenol may help, unknown triggers, she has a good routine, she sleeps well, eats well, exercise. Stays hydrated. She has a lot of nausea.    Reviewed notes, labs and imaging from outside  physicians, which showed:   Cbc nml, cmp unremarkab;e BUN 12 and cr 0.71   MRI of the brain 07/2015    There is no evidence of acute infarct, intracranial hemorrhage, mass, midline shift, or extra-axial fluid collection. Ventricles and sulci are normal. Brain is normal in signal.   Orbits are unremarkable. There is mild bilateral ethmoid, sphenoid, and maxillary sinus mucosal thickening with small fluid levels in the left greater than right maxillary sinuses. Mastoid air cells clear.  Major intracranial vascular flow voids are preserved.   IMPRESSION: 1. Unremarkable appearance of the brain. 2. Paranasal sinus mucosal thickening and fluid. Correlate for acute sinusitis.     REVIEW OF SYSTEMS: Out of a complete 14 system review of symptoms, the patient complains only of the following symptoms, headaches, anxiety, vomiting, and all other reviewed systems are negative.    ALLERGIES: No Known Allergies   HOME MEDICATIONS: Outpatient Medications Prior to Visit  Medication Sig Dispense Refill   Fremanezumab-vfrm (AJOVY) 225 MG/1.5ML SOAJ Inject 225 mg into the skin every 30 (thirty) days. 4.5 mL 0   gabapentin (NEURONTIN) 100 MG capsule Take 2 capsules (200 mg total) by mouth 3 (three) times daily. Take one to two capsules three times daily PRN anxiety 180 capsule 2   gabapentin (NEURONTIN) 100 MG capsule Take 2 capsules (200 mg total) by mouth 3 (three) times daily. 90 capsule 2   ondansetron (ZOFRAN-ODT) 4 MG disintegrating tablet May take 4-8mg  every 8 hours for nausea 30 tablet 3   oxymetazoline (AFRIN) 0.05 % nasal spray Place 1 spray into both nostrils 2 (two) times daily as needed for congestion.     rizatriptan (MAXALT-MLT) 10 MG disintegrating tablet TAKE ONE TABLET BY MOUTH AT ONSET OF HEADACHE; MAY REPEAT ONE TABLET IN 2 HOURS IF NEEDED. 9 tablet 11   topiramate (TOPAMAX) 50 MG tablet Stake 2 pills at bedtime (100mg ) for 2 weeks then increase to 3 pills at bedtime (150mg ) 270 tablet 4   No facility-administered medications prior to visit.     PAST MEDICAL HISTORY: Past Medical History:  Diagnosis Date   Migraine      PAST SURGICAL HISTORY: Past Surgical History:  Procedure Laterality Date   OTHER SURGICAL HISTORY Left    Nexplanon      FAMILY HISTORY: Family History  Problem Relation Age of Onset   Bipolar disorder Mother    High Cholesterol Mother    High blood pressure Mother    Migraines Mother    Sinusitis Mother    ADD / ADHD  Brother      SOCIAL HISTORY: Social History   Socioeconomic History   Marital status: Single    Spouse name: Not on file   Number of children: 0   Years of education: Not on file   Highest education level: Not on file  Occupational History   Not on file  Tobacco Use   Smoking status: Never   Smokeless tobacco: Never   Tobacco comments:    vapes nicotine  Substance and Sexual Activity   Alcohol use: No    Alcohol/week: 0.0 standard drinks   Drug use: No   Sexual activity: Yes    Birth control/protection: Implant  Other Topics Concern   Not on file  Social History Narrative   Kruti graduated from high school. She plans on attending [employer] in the fall and majoring in Psychology.   She lives with her adoptive grandmother and biological, maternal half-brother.       Caffeine:  maybe 1 cup/week   Social Determinants of Health   Financial Resource Strain: Not on file  Food Insecurity: Not on file  Transportation Needs: Not on file  Physical Activity: Not on file  Stress: Not on file  Social Connections: Not on file  Intimate Partner Violence: Not on file      PHYSICAL EXAM  There were no vitals filed for this visit.  There is no height or weight on file to calculate BMI.   Generalized: Well developed, in no acute distress  Cardiology: normal rate and rhythm, no murmur auscultated  Respiratory: clear to auscultation bilaterally    Neurological examination  Mentation: Alert oriented to time, place, history taking. Follows all commands speech and language fluent Cranial nerve II-XII: Pupils were equal round reactive to light. Extraocular movements were full, visual field were full on confrontational test. Facial sensation and strength were normal. Head turning and shoulder shrug  were normal and symmetric. Motor: The motor testing reveals 5 over 5 strength of all 4 extremities. Good symmetric motor tone is noted throughout.  Gait and station: Gait is normal.      DIAGNOSTIC DATA (LABS, IMAGING, TESTING) - I reviewed patient records, labs, notes, testing and imaging myself where available.  Lab Results  Component Value Date   WBC 10.3 03/14/2020   HGB 13.2 03/14/2020   HCT 41.2 03/14/2020   MCV 96.3 03/14/2020   PLT 292 03/14/2020      Component Value Date/Time   NA 135 03/14/2020 2214   K 3.6 03/14/2020 2214   CL 101 03/14/2020 2214   CO2 21 (L) 03/14/2020 2214   GLUCOSE 108 (H) 03/14/2020 2214   BUN 12 03/14/2020 2214   CREATININE 0.71 03/14/2020 2214   CALCIUM 9.5 03/14/2020 2214   PROT 8.2 (H) 03/14/2020 2214   ALBUMIN 4.8 03/14/2020 2214   AST 20 03/14/2020 2214   ALT 32 03/14/2020 2214   ALKPHOS 57 03/14/2020 2214   BILITOT 1.7 (H) 03/14/2020 2214   GFRNONAA >60 03/14/2020 2214   GFRAA >60 03/14/2020 2214   No results found for: CHOL, HDL, LDLCALC, LDLDIRECT, TRIG, CHOLHDL No results found for: ZOXW9UHGBA1C No results found for: VITAMINB12 No results found for: TSH  No flowsheet data found.   No flowsheet data found.   ASSESSMENT AND PLAN  25 y.o. year old female  has a past medical history of Migraine. here with     No diagnosis found.  Penni BombardKendall continues to have about 8-10 migraine days a month. She feels that topiramate has helped and is tolerating well, however, she has lost about 20 pounds. It is unclear over what period of time. She reports vomiting "all day" at least once a week. Most of these episodes are associated with migraine headaches. I will have her continue topiramate 150mg  daily for now but we will monitor her weight very closely. She will continue rizatriptan for abortive therapy. I will also have her take 4-8mg  of ondansetron every 8 hours on days where she is vomiting. Advised against regular use, otherwise. I am concerned about adding propranolol due to low blood pressure readings and do not want to add tricyclic antidepressants at this time. May consider talking with psychiatry if she is unable to  find insurance coverage. I will start Ajovy injections every 30 days. I feel there will be less side effects with CGRP. First injection given in the office. She will administer the next two samples every 30 days. She will call [facility] to inquire  about patient assistance. I will also have her call [facility] to inquire about any possible assistance. She will continue close follow up with psychiatry. She will return to see me or Aundra Millet (seen by me in her absence) in 3 months. She verbalizes understanding and agreement with this plan.    No orders of the defined types were placed in this encounter.    No orders of the defined types were placed in this encounter.    Shawnie Dapper, MSN, FNP-C 04/29/2021, 7:31 AM  [facility] [address] [place], [place] [address] [phone]

## 2021-05-03 ENCOUNTER — Other Ambulatory Visit: Payer: Self-pay

## 2021-05-03 ENCOUNTER — Encounter (HOSPITAL_COMMUNITY): Payer: Self-pay

## 2021-05-03 ENCOUNTER — Telehealth (INDEPENDENT_AMBULATORY_CARE_PROVIDER_SITE_OTHER): Payer: No Payment, Other | Admitting: Psychiatry

## 2021-05-03 DIAGNOSIS — F411 Generalized anxiety disorder: Secondary | ICD-10-CM

## 2021-05-03 DIAGNOSIS — Z0289 Encounter for other administrative examinations: Secondary | ICD-10-CM

## 2021-05-03 MED ORDER — HYDROXYZINE HCL 25 MG PO TABS: 25.0000 mg | ORAL_TABLET | Freq: Every evening | ORAL | 2 refills | Status: DC | PRN

## 2021-05-03 MED ORDER — GABAPENTIN 100 MG PO CAPS: 200.0000 mg | ORAL_CAPSULE | Freq: Three times a day (TID) | ORAL | 2 refills | Status: DC
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
## 2021-06-01 NOTE — Progress Notes (Signed)
Chief Complaint  Patient presents with   Follow-up    New rm, alone. Here for migraine f/u, pt was unable to take her Ajovy shot this month, pt reports doing well. Pt had 1 migraine while taking Ajovy.      HISTORY OF PRESENT ILLNESS:  06/02/21 ALL:  Nicole Francis returns for follow up for migraine headaches. She continues topiramate 150mg  at bedtime. We added Ajovy injections at last visit visit. She reports headaches significantly improved. She did not have any headaches in June and only one mild headache in July and August. Rizatripan and ondansetron aid in abortive therapy. She also continues gabapentin 200mg  TID for anxiety with psychiatry. Nausea was much better in June and July with very little vomiting but reports nausea returned in August. She has gained 6 pounds.   01/27/2021 ALL:  Nicole Francis is a 25 y.o. female here today for follow up for migraines. She continues topiramate 150mg  at bedtime. She has continued rizatriptan with Tylenol that seems to help some. She is unsure if Nurtec helped. Gabapentin was increased to 200mg  TID by psychiatry. She feels this has helped with mood but not much with headaches. She continues to have 2-3 headaches a week, most are migrainous. She is taking rizatriptan at least 2 times a month. She has not noted any specific side effects with taking topiramate but reports losing about 20 pounds. Weight loss confirmed with review of chart. Unclear over what period of time. She reports vomiting "everything" she eats once a week. Most of these days she has a headache. There have been a few episodes where she vomits and does not have a headache. She is using ondansetron but does not usually repeat dose. She reports submitting paperwork for patient assistance. She can not tell me when. She has not followed up.   HISTORY (copied from Erik ObeyMegan Millikan's previous note)  10/13/20:   Nicole Francis is a 25 year old female with a history of migraine headaches.  She  reports that she continues to get 1-2 headaches a week.  She reports that some of these headaches are dull headaches for which she just takes Tylenol.  There are others that are more severe and she does use the rizatriptan.  She states that typically when she takes the rizatriptan her headache will resolve in 1 hour but then it may come back.  At the last visit she was given samples of Nurtec but she "does not know what she did with them."  It seems that she never tried this medication.  The patient continues on Topamax 150 mg at bedtime.  She recently was put on gabapentin 100 mg 3 times a day for anxiety and bipolar per the patient.  She returns today for an evaluation.   HISTORY Interval history: migraines better but not great. We discussed options. She "forgot" to email me and also "forgot" to seek out [facility] financial assistance. We will increae topiramate.   HPI:  Nicole Francis is a 25 y.o. female here as requested by ED for migraines. Migraines since the age of 25. She tried vitamin b and magnesium and didn't help. Can be the entire top of the head, sometimes the sides of the head, pulsating/pounding/throbbing,nausea,light and sound sensitivity, movement makes it work, skin sensitivity as well. Mom has migraines. Migraines can last 24 hours to several weeks on and off. The last few days she has been very sick because of a migraine and now she is feeling better. She does not take  OTC meds, maybe tylenol. No aura. She gets dizzy, lightheaded, cold or hot. Over the last few years they have worsened in frequency and some have been more severe, quality the same, she wakes with headaches, she can have blurry vision, nor positional or exertional. Tylenol may help, unknown triggers, she has a good routine, she sleeps well, eats well, exercise. Stays hydrated. She has a lot of nausea.    Reviewed notes, labs and imaging from outside physicians, which showed:   Cbc nml, cmp unremarkab;e BUN 12 and cr 0.71    MRI of the brain 07/2015    There is no evidence of acute infarct, intracranial hemorrhage, mass, midline shift, or extra-axial fluid collection. Ventricles and sulci are normal. Brain is normal in signal.   Orbits are unremarkable. There is mild bilateral ethmoid, sphenoid, and maxillary sinus mucosal thickening with small fluid levels in the left greater than right maxillary sinuses. Mastoid air cells clear. Major intracranial vascular flow voids are preserved.   IMPRESSION: 1. Unremarkable appearance of the brain. 2. Paranasal sinus mucosal thickening and fluid. Correlate for acute sinusitis.   REVIEW OF SYSTEMS: Out of a complete 14 system review of symptoms, the patient complains only of the following symptoms, headaches, anxiety, vomiting, and all other reviewed systems are negative.   ALLERGIES: No Known Allergies   HOME MEDICATIONS: Outpatient Medications Prior to Visit  Medication Sig Dispense Refill   Fremanezumab-vfrm (AJOVY) 225 MG/1.5ML SOAJ Inject 225 mg into the skin every 30 (thirty) days. 4.5 mL 0   hydrOXYzine (ATARAX/VISTARIL) 25 MG tablet Take 1 tablet (25 mg total) by mouth at bedtime as needed (sleep). 30 tablet 2   ondansetron (ZOFRAN-ODT) 4 MG disintegrating tablet May take 4-8mg  every 8 hours for nausea 30 tablet 3   oxymetazoline (AFRIN) 0.05 % nasal spray Place 1 spray into both nostrils 2 (two) times daily as needed for congestion.     rizatriptan (MAXALT-MLT) 10 MG disintegrating tablet TAKE ONE TABLET BY MOUTH AT ONSET OF HEADACHE; MAY REPEAT ONE TABLET IN 2 HOURS IF NEEDED. 9 tablet 11   topiramate (TOPAMAX) 50 MG tablet Stake 2 pills at bedtime (100mg ) for 2 weeks then increase to 3 pills at bedtime (150mg ) 270 tablet 4   gabapentin (NEURONTIN) 100 MG capsule Take 2 capsules (200 mg total) by mouth 3 (three) times daily. Take one to two capsules three times daily PRN anxiety 180 capsule 2   gabapentin (NEURONTIN) 100 MG capsule Take 2 capsules (200  mg total) by mouth 3 (three) times daily. 90 capsule 2   No facility-administered medications prior to visit.     PAST MEDICAL HISTORY: Past Medical History:  Diagnosis Date   Migraine      PAST SURGICAL HISTORY: Past Surgical History:  Procedure Laterality Date   OTHER SURGICAL HISTORY Left    Nexplanon      FAMILY HISTORY: Family History  Problem Relation Age of Onset   Bipolar disorder Mother    High Cholesterol Mother    High blood pressure Mother    Migraines Mother    Sinusitis Mother    ADD / ADHD Brother      SOCIAL HISTORY: Social History   Socioeconomic History   Marital status: Single    Spouse name: Not on file   Number of children: 0   Years of education: Not on file   Highest education level: Not on file  Occupational History   Not on file  Tobacco Use   Smoking status:  Never   Smokeless tobacco: Never   Tobacco comments:    vapes nicotine  Substance and Sexual Activity   Alcohol use: No    Alcohol/week: 0.0 standard drinks   Drug use: No   Sexual activity: Yes    Birth control/protection: Implant  Other Topics Concern   Not on file  Social History Narrative   Nicole Francis graduated from high school. She plans on attending [employer] in the fall and majoring in Psychology.   She lives with her adoptive grandmother and biological, maternal half-brother.       Caffeine: maybe 1 cup/week   Social Determinants of Health   Financial Resource Strain: Not on file  Food Insecurity: Not on file  Transportation Needs: Not on file  Physical Activity: Not on file  Stress: Not on file  Social Connections: Not on file  Intimate Partner Violence: Not on file      PHYSICAL EXAM  Vitals:   06/02/21 1500  BP: 108/71  Pulse: 99  Weight: 106 lb (48.1 kg)  Height: 5' (1.524 m)    Body mass index is 20.7 kg/m.   Generalized: Well developed, in no acute distress  Cardiology: normal rate and rhythm, no murmur auscultated  Respiratory: clear to  auscultation bilaterally    Neurological examination  Mentation: Alert oriented to time, place, history taking. Follows all commands speech and language fluent Cranial nerve II-XII: Pupils were equal round reactive to light. Extraocular movements were full, visual field were full on confrontational test. Facial sensation and strength were normal. Head turning and shoulder shrug  were normal and symmetric. Motor: The motor testing reveals 5 over 5 strength of all 4 extremities. Good symmetric motor tone is noted throughout.  Gait and station: Gait is normal.     DIAGNOSTIC DATA (LABS, IMAGING, TESTING) - I reviewed patient records, labs, notes, testing and imaging myself where available.  Lab Results  Component Value Date   WBC 10.3 03/14/2020   HGB 13.2 03/14/2020   HCT 41.2 03/14/2020   MCV 96.3 03/14/2020   PLT 292 03/14/2020      Component Value Date/Time   NA 135 03/14/2020 2214   K 3.6 03/14/2020 2214   CL 101 03/14/2020 2214   CO2 21 (L) 03/14/2020 2214   GLUCOSE 108 (H) 03/14/2020 2214   BUN 12 03/14/2020 2214   CREATININE 0.71 03/14/2020 2214   CALCIUM 9.5 03/14/2020 2214   PROT 8.2 (H) 03/14/2020 2214   ALBUMIN 4.8 03/14/2020 2214   AST 20 03/14/2020 2214   ALT 32 03/14/2020 2214   ALKPHOS 57 03/14/2020 2214   BILITOT 1.7 (H) 03/14/2020 2214   GFRNONAA >60 03/14/2020 2214   GFRAA >60 03/14/2020 2214   No results found for: CHOL, HDL, LDLCALC, LDLDIRECT, TRIG, CHOLHDL No results found for: WNIO2V No results found for: VITAMINB12 No results found for: TSH  No flowsheet data found.   No flowsheet data found.   ASSESSMENT AND PLAN  25 y.o. year old female  has a past medical history of Migraine. here with   Chronic migraine without aura with status migrainosus, not intractable  Anxiety  Sargun reports migraines were significantly better on Ajovy. She has noticed more nausea in August after not having injection but headaches seem to remain fairly well  controlled. She will continue topiramate, rizatriptan and ondansetron as prescribed. I have given her one additional sample of Ajovy. She will call [facility] to inquire about patient assistance. I have stressed the importance of getting insurance  benefits as cost of Ajovy is not feasible for her. We will consider adding amitriptyline or nortriptyline if unable to get Ajovy covered. She will continue close follow up with psychiatry and discuss these options with provider. She will return to see me in 3 months. She verbalizes understanding and agreement with this plan.    No orders of the defined types were placed in this encounter.    Meds ordered this encounter  Medications   Fremanezumab-vfrm (AJOVY) 225 MG/1.5ML SOAJ    Sig: Inject 225 mg into the skin every 30 (thirty) days.    Dispense:  1.5 mL    Refill:  0    Order Specific Question:   Supervising Provider    Answer:   Anson Fret [[id number]]    Order Specific Question:   Lot Number?    Answer:   [id number]    Order Specific Question:   Expiration Date?    Answer:   05/27/2022      Shawnie Dapper, MSN, FNP-C 06/02/2021, 3:37 PM  [facility] [address] [place], [place] [address] [phone]

## 2021-06-01 NOTE — Patient Instructions (Signed)
Below is our plan:  I have one additional sample of Ajovy in the office, today. Please take this once you are home. This will allow for about 3-4 weeks for you to focus on trying to get patient assistance. Please use form given to you today to check on [facility] financial assistance. I will plan to start either amitriptyline or nortriptyline if you can not get Ajovy covered. Please discuss these options with psychiatry to make sure they do not have any objections.   Please make sure you are staying well hydrated. I recommend 50-60 ounces daily. Well balanced diet and regular exercise encouraged. Consistent sleep schedule with 6-8 hours recommended.   Please continue follow up with care team as directed.   Follow up with me in 3-4 months   You may receive a survey regarding today's visit. I encourage you to leave honest feed back as I do use this information to improve patient care. Thank you for seeing me today!

## 2021-06-02 ENCOUNTER — Other Ambulatory Visit: Payer: Self-pay

## 2021-06-02 ENCOUNTER — Ambulatory Visit (INDEPENDENT_AMBULATORY_CARE_PROVIDER_SITE_OTHER): Payer: Self-pay | Admitting: Family Medicine

## 2021-06-02 ENCOUNTER — Encounter: Payer: Self-pay | Admitting: Family Medicine

## 2021-06-02 VITALS — BP 108/71 | HR 99 | Ht 60.0 in | Wt 106.0 lb

## 2021-06-02 DIAGNOSIS — F419 Anxiety disorder, unspecified: Secondary | ICD-10-CM

## 2021-06-02 DIAGNOSIS — G43701 Chronic migraine without aura, not intractable, with status migrainosus: Secondary | ICD-10-CM

## 2021-06-02 MED ORDER — AJOVY 225 MG/1.5ML ~~LOC~~ SOAJ
225.0000 mg | SUBCUTANEOUS | 0 refills | Status: DC
Start: 2021-06-02 — End: 2021-10-11

## 2021-07-16 ENCOUNTER — Other Ambulatory Visit: Payer: Self-pay | Admitting: Neurology

## 2021-08-02 ENCOUNTER — Encounter (HOSPITAL_COMMUNITY): Payer: Self-pay

## 2021-08-02 ENCOUNTER — Other Ambulatory Visit: Payer: Self-pay

## 2021-08-02 ENCOUNTER — Telehealth (INDEPENDENT_AMBULATORY_CARE_PROVIDER_SITE_OTHER): Payer: No Payment, Other | Admitting: Psychiatry

## 2021-08-02 DIAGNOSIS — F411 Generalized anxiety disorder: Secondary | ICD-10-CM | POA: Diagnosis not present

## 2021-08-02 MED ORDER — GABAPENTIN 100 MG PO CAPS: 200.0000 mg | ORAL_CAPSULE | Freq: Three times a day (TID) | ORAL | 2 refills | Status: DC

## 2021-08-02 MED ORDER — HYDROXYZINE HCL 25 MG PO TABS: 25.0000 mg | ORAL_TABLET | Freq: Every evening | ORAL | 2 refills | Status: DC | PRN

## 2021-08-02 NOTE — Progress Notes (Signed)
BH NP OP Progress Note  Patient Identification: Nicole Francis MRN:  [id number] Date of Evaluation:  08/02/2021 Referral Source: Vesta Mixer Chief Complaint:   Chief Complaint   Anxiety; Depression; Follow-up    Depression and anxiety Visit Diagnosis: General anxiety disorder  History of Present Illness:  25 y/o female presents today for f/u anxiety. Anxiety is low, "I'm doing better",  no panic attacks, gabapentin doing well.  Her sleep is "good" with the hydroxyzine.  Appetite is steady, maintaining her weight despite being placed on Topamax for her headaches.  No depression.  Less stressors in her life at this time.  Discussed her coping skills and she is managing her anxiety well.  No suicidal/homicidal ideations, hallucinations, or substance abuse.  She is functioning well and enjoying time with her grandmother decorating for the holidays and spending time with her and her boyfriend.  Past Psychiatric History: Panic d/o, social anxiety, PTSD, bipolar d/o, depression  Previous Psychotropic Medications: Yes   Substance Abuse History in the last 12 months:  No.  Consequences of Substance Abuse: NA  Past Medical History:  Past Medical History:  Diagnosis Date   Migraine     Past Surgical History:  Procedure Laterality Date   OTHER SURGICAL HISTORY Left    Nexplanon     Family Psychiatric History: see below  Family History:  Family History  Problem Relation Age of Onset   Bipolar disorder Mother    High Cholesterol Mother    High blood pressure Mother    Migraines Mother    Sinusitis Mother    ADD / ADHD Brother     Social History:   Social History   Socioeconomic History   Marital status: Single    Spouse name: Not on file   Number of children: 0   Years of education: Not on file   Highest education level: Not on file  Occupational History   Not on file  Tobacco Use   Smoking status: Never   Smokeless tobacco: Never   Tobacco comments:    vapes nicotine   Substance and Sexual Activity   Alcohol use: No    Alcohol/week: 0.0 standard drinks   Drug use: No   Sexual activity: Yes    Birth control/protection: Implant  Other Topics Concern   Not on file  Social History Narrative   Lequita graduated from high school. She plans on attending [employer] in the fall and majoring in Psychology.   She lives with her adoptive grandmother and biological, maternal half-brother.       Caffeine: maybe 1 cup/week   Social Determinants of Health   Financial Resource Strain: Not on file  Food Insecurity: Not on file  Transportation Needs: Not on file  Physical Activity: Not on file  Stress: Not on file  Social Connections: Not on file    Additional Social History: lives with her mother, supportive boyfriend  Allergies:  No Known Allergies  Metabolic Disorder Labs: No results found for: HGBA1C, MPG No results found for: PROLACTIN No results found for: CHOL, TRIG, HDL, CHOLHDL, VLDL, LDLCALC No results found for: TSH  Therapeutic Level Labs: No results found for: LITHIUM No results found for: CBMZ No results found for: VALPROATE  Current Medications: Current Outpatient Medications  Medication Sig Dispense Refill   Fremanezumab-vfrm (AJOVY) 225 MG/1.5ML SOAJ Inject 225 mg into the skin every 30 (thirty) days. 4.5 mL 0   Fremanezumab-vfrm (AJOVY) 225 MG/1.5ML SOAJ Inject 225 mg into the skin every 30 (thirty) days.  1.5 mL 0   gabapentin (NEURONTIN) 100 MG capsule Take 2 capsules (200 mg total) by mouth 3 (three) times daily. Take one to two capsules three times daily PRN anxiety 180 capsule 2   hydrOXYzine (ATARAX/VISTARIL) 25 MG tablet Take 1 tablet (25 mg total) by mouth at bedtime as needed (sleep). 30 tablet 2   ondansetron (ZOFRAN-ODT) 4 MG disintegrating tablet May take 4-8mg  every 8 hours for nausea 30 tablet 3   oxymetazoline (AFRIN) 0.05 % nasal spray Place 1 spray into both nostrils 2 (two) times daily as needed for congestion.      rizatriptan (MAXALT-MLT) 10 MG disintegrating tablet TAKE ONE TABLET BY MOUTH AT ONSET OF HEADACHE; MAY REPEAT ONE TABLET IN 2 HOURS IF NEEDED. 9 tablet 11   topiramate (TOPAMAX) 50 MG tablet Take 3 tablets (150 mg total) by mouth at bedtime. 270 tablet 0   No current facility-administered medications for this visit.    Musculoskeletal: Strength & Muscle Tone: within normal limits Gait & Station: normal Patient leans: N/A  Psychiatric Specialty Exam: Review of Systems  Psychiatric/Behavioral:  The patient is nervous/anxious.   All other systems reviewed and are negative.  There were no vitals taken for this visit.There is no height or weight on file to calculate BMI.  General Appearance: Casual  Eye Contact:  Good  Speech:  Clear and Coherent  Volume:  Normal  Mood:  Anxiety  Affect:  Congruent  Thought Process:  Coherent and Descriptions of Associations: Intact  Orientation:  Full (Time, Place, and Person)  Thought Content:  WDL and Logical  Suicidal Thoughts:  No  Homicidal Thoughts:  No  Memory:  Immediate;   Good Recent;   Good Remote;   Good  Judgement:  Good  Insight:  Good  Psychomotor Activity:  Good  Concentration:  Concentration: Fair and Attention Span: Fair  Recall:  Good  Fund of Knowledge:Good  Language: Good  Akathisia:  No  Handed:  Right  AIMS (if indicated):  not done  Assets:  Housing Leisure Time Resilience Social Support  ADL's:  Intact  Cognition: WNL  Sleep:  Good   Screenings: GAD-7    Flowsheet Row Video Visit from 08/11/2020 in [facility]  Total GAD-7 Score 18      PHQ2-9    Flowsheet Row Video Visit from 11/16/2020 in [facility]  PHQ-2 Total Score 1      Flowsheet Row Video Visit from 11/16/2020 in [facility]  C-SSRS RISK CATEGORY No Risk     GAD of 18  Assessment and Plan:  General anxiety disorder: Continue gabapentin 200 mg  TID -Follow up in 3 months -F/up with therapy  Insomnia: -continue hydroxyzine 25 mg at bedtime PRN, may repeat once in an hour if not asleep  Virtual Visit via Video Note  I connected with Delman Cheadle on 08/02/21 at  3:00 PM EST by a video enabled telemedicine application and verified that I am speaking with the correct person using two identifiers.  Location: Patient: Home Provider: Home office   I discussed the limitations of evaluation and management by telemedicine and the availability of in person appointments. The patient expressed understanding and agreed to proceed.  Follow Up Instructions: Follow up in 3 months   I discussed the assessment and treatment plan with the patient. The patient was provided an opportunity to ask questions and all were answered. The patient agreed with the plan and demonstrated  an understanding of the instructions.   The patient was advised to call back or seek an in-person evaluation if the symptoms worsen or if the condition fails to improve as anticipated.  I provided 20 minutes of non-face-to-face time during this encounter.   Nanine Means, NP   Nanine Means, NP 11/7/20223:08 PM

## 2021-08-03 ENCOUNTER — Other Ambulatory Visit (HOSPITAL_COMMUNITY): Payer: Self-pay | Admitting: *Deleted

## 2021-08-03 MED ORDER — GABAPENTIN 100 MG PO CAPS: 200.0000 mg | ORAL_CAPSULE | Freq: Three times a day (TID) | ORAL | 2 refills | Status: DC

## 2021-10-11 ENCOUNTER — Encounter: Payer: Self-pay | Admitting: Family Medicine

## 2021-10-11 ENCOUNTER — Ambulatory Visit (INDEPENDENT_AMBULATORY_CARE_PROVIDER_SITE_OTHER): Payer: Self-pay | Admitting: Family Medicine

## 2021-10-11 VITALS — BP 100/50 | HR 88 | Ht 60.0 in | Wt 118.5 lb

## 2021-10-11 DIAGNOSIS — G43701 Chronic migraine without aura, not intractable, with status migrainosus: Secondary | ICD-10-CM

## 2021-10-11 MED ORDER — RIZATRIPTAN BENZOATE 10 MG PO TBDP: 10.0000 mg | ORAL_TABLET | ORAL | 11 refills | Status: AC | PRN

## 2021-10-11 MED ORDER — ONDANSETRON 4 MG PO TBDP: ORAL_TABLET | ORAL | 3 refills | Status: DC

## 2021-10-11 MED ORDER — AMITRIPTYLINE HCL 10 MG PO TABS: 10.0000 mg | ORAL_TABLET | Freq: Every day | ORAL | 3 refills | Status: DC

## 2021-10-11 MED ORDER — TOPIRAMATE 50 MG PO TABS: 150.0000 mg | ORAL_TABLET | Freq: Every day | ORAL | 1 refills | Status: DC

## 2021-10-11 NOTE — Progress Notes (Signed)
Chief Complaint  Patient presents with   Follow-up    Rm 11, alone. Here for 4 month migraine f/u. Not doing great last couple days. Pt reports feeling nauseas since Sat even till this AM. Pn around R eyebrow and behind eye. Pt does take ondansetron but tends to vomit 30 min afterwards. Pt does tend to get nausea when feeling hot. Last Ajovy inj, was in Sept or Oct.      HISTORY OF PRESENT ILLNESS:  10/11/21 ALL:  Nicole Francis for follow up for migraines. She continues topiramate 150mg  daily. Ajovy last in September. She did not follow up with financial assistance. Headaches are stable. No significant change. She is having 12-16 headache days with 2-3 migrainous headaches a month.  Rizatriptan and ondansetron help with abortive therapy. She also uses Tylenol OTC, usually 2-3 times a week. She is uninsured. Unemployed. She was recently engaged. She continues to see psychiatry regularly. She feels mood is stable.    06/02/2021 ALL: Nicole Francis for follow up for migraine headaches. She continues topiramate 150mg  at bedtime. We added Ajovy injections at last visit visit. She reports headaches significantly improved. She did not have any headaches in June and only one mild headache in July and August. Rizatripan and ondansetron aid in abortive therapy. She also continues gabapentin 200mg  TID for anxiety with psychiatry. Nausea was much better in June and July with very little vomiting but reports nausea returned in August. She has gained 6 pounds.   01/27/2021 ALL:  Nicole Francis is a 26 y.o. female here today for follow up for migraines. She continues topiramate 150mg  at bedtime. She has continued rizatriptan with Tylenol that seems to help some. She is unsure if Nurtec helped. Gabapentin was increased to 200mg  TID by psychiatry. She feels this has helped with mood but not much with headaches. She continues to have 2-3 headaches a week, most are migrainous. She is taking rizatriptan at  least 2 times a month. She has not noted any specific side effects with taking topiramate but reports losing about 20 pounds. Weight loss confirmed with review of chart. Unclear over what period of time. She reports vomiting "everything" she eats once a week. Most of these days she has a headache. There have been a few episodes where she vomits and does not have a headache. She is using ondansetron but does not usually repeat dose. She reports submitting paperwork for patient assistance. She can not tell me when. She has not followed up.   HISTORY (copied from Erik ObeyMegan Millikan's previous note)  10/13/20:   Nicole Francis is a 26 year old female with a history of migraine headaches.  She reports that she continues to get 1-2 headaches a week.  She reports that some of these headaches are dull headaches for which she just takes Tylenol.  There are others that are more severe and she does use the rizatriptan.  She states that typically when she takes the rizatriptan her headache will resolve in 1 hour but then it may come back.  At the last visit she was given samples of Nurtec but she "does not know what she did with them."  It seems that she never tried this medication.  The patient continues on Topamax 150 mg at bedtime.  She recently was put on gabapentin 100 mg 3 times a day for anxiety and bipolar per the patient.  She Francis today for an evaluation.   HISTORY Interval history: migraines better but not great. We discussed  options. She "forgot" to email me and also "forgot" to seek out [facility] financial assistance. We will increae topiramate.   HPI:  Nicole Francis is a 26 y.o. female here as requested by ED for migraines. Migraines since the age of 62. She tried vitamin b and magnesium and didn't help. Can be the entire top of the head, sometimes the sides of the head, pulsating/pounding/throbbing,nausea,light and sound sensitivity, movement makes it work, skin sensitivity as well. Mom has migraines.  Migraines can last 24 hours to several weeks on and off. The last few days she has been very sick because of a migraine and now she is feeling better. She does not take OTC meds, maybe tylenol. No aura. She gets dizzy, lightheaded, cold or hot. Over the last few years they have worsened in frequency and some have been more severe, quality the same, she wakes with headaches, she can have blurry vision, nor positional or exertional. Tylenol may help, unknown triggers, she has a good routine, she sleeps well, eats well, exercise. Stays hydrated. She has a lot of nausea.    Reviewed notes, labs and imaging from outside physicians, which showed:   Cbc nml, cmp unremarkab;e BUN 12 and cr 0.71   MRI of the brain 07/2015    There is no evidence of acute infarct, intracranial hemorrhage, mass, midline shift, or extra-axial fluid collection. Ventricles and sulci are normal. Brain is normal in signal.   Orbits are unremarkable. There is mild bilateral ethmoid, sphenoid, and maxillary sinus mucosal thickening with small fluid levels in the left greater than right maxillary sinuses. Mastoid air cells clear. Major intracranial vascular flow voids are preserved.   IMPRESSION: 1. Unremarkable appearance of the brain. 2. Paranasal sinus mucosal thickening and fluid. Correlate for acute sinusitis.   REVIEW OF SYSTEMS: Out of a complete 14 system review of symptoms, the patient complains only of the following symptoms, headaches, anxiety, vomiting, and all other reviewed systems are negative.   ALLERGIES: No Known Allergies   HOME MEDICATIONS: Outpatient Medications Prior to Visit  Medication Sig Dispense Refill   hydrOXYzine (ATARAX/VISTARIL) 25 MG tablet Take 1 tablet (25 mg total) by mouth at bedtime as needed (sleep). 30 tablet 2   oxymetazoline (AFRIN) 0.05 % nasal spray Place 1 spray into both nostrils 2 (two) times daily as needed for congestion.     Fremanezumab-vfrm (AJOVY) 225 MG/1.5ML SOAJ  Inject 225 mg into the skin every 30 (thirty) days. 4.5 mL 0   Fremanezumab-vfrm (AJOVY) 225 MG/1.5ML SOAJ Inject 225 mg into the skin every 30 (thirty) days. 1.5 mL 0   ondansetron (ZOFRAN-ODT) 4 MG disintegrating tablet May take 4-8mg  every 8 hours for nausea 30 tablet 3   rizatriptan (MAXALT-MLT) 10 MG disintegrating tablet TAKE ONE TABLET BY MOUTH AT ONSET OF HEADACHE; MAY REPEAT ONE TABLET IN 2 HOURS IF NEEDED. 9 tablet 11   topiramate (TOPAMAX) 50 MG tablet Take 3 tablets (150 mg total) by mouth at bedtime. 270 tablet 0   gabapentin (NEURONTIN) 100 MG capsule Take 2 capsules (200 mg total) by mouth 3 (three) times daily. Sig is 200 mg three times a day, no allowance for prn for this medicine 180 capsule 2   No facility-administered medications prior to visit.     PAST MEDICAL HISTORY: Past Medical History:  Diagnosis Date   Migraine      PAST SURGICAL HISTORY: Past Surgical History:  Procedure Laterality Date   OTHER SURGICAL HISTORY Left    Nexplanon  FAMILY HISTORY: Family History  Problem Relation Age of Onset   Bipolar disorder Mother    High Cholesterol Mother    High blood pressure Mother    Migraines Mother    Sinusitis Mother    ADD / ADHD Brother      SOCIAL HISTORY: Social History   Socioeconomic History   Marital status: Single    Spouse name: Not on file   Number of children: 0   Years of education: Not on file   Highest education level: Not on file  Occupational History   Not on file  Tobacco Use   Smoking status: Never   Smokeless tobacco: Never   Tobacco comments:    vapes nicotine  Substance and Sexual Activity   Alcohol use: No    Alcohol/week: 0.0 standard drinks   Drug use: No   Sexual activity: Yes    Birth control/protection: Implant  Other Topics Concern   Not on file  Social History Narrative   Nicole Francis graduated from high school. She plans on attending [employer] in the fall and majoring in Psychology.   She lives with her  adoptive grandmother and biological, maternal half-brother.       Caffeine: maybe 1 cup/week   Social Determinants of Health   Financial Resource Strain: Not on file  Food Insecurity: Not on file  Transportation Needs: Not on file  Physical Activity: Not on file  Stress: Not on file  Social Connections: Not on file  Intimate Partner Violence: Not on file      PHYSICAL EXAM  Vitals:   10/11/21 1357  BP: (!) 100/50  Pulse: 88  SpO2: 99%  Weight: 118 lb 8 oz (53.8 kg)  Height: 5' (1.524 m)     Body mass index is 23.14 kg/m.   Generalized: Well developed, in no acute distress  Cardiology: normal rate and rhythm, no murmur auscultated  Respiratory: clear to auscultation bilaterally    Neurological examination  Mentation: Alert oriented to time, place, history taking. Follows all commands speech and language fluent Cranial nerve II-XII: Pupils were equal round reactive to light. Extraocular movements were full, visual field were full on confrontational test. Facial sensation and strength were normal. Head turning and shoulder shrug  were normal and symmetric. Motor: The motor testing reveals 5 over 5 strength of all 4 extremities. Good symmetric motor tone is noted throughout.  Gait and station: Gait is normal.     DIAGNOSTIC DATA (LABS, IMAGING, TESTING) - I reviewed patient records, labs, notes, testing and imaging myself where available.  Lab Results  Component Value Date   WBC 10.3 03/14/2020   HGB 13.2 03/14/2020   HCT 41.2 03/14/2020   MCV 96.3 03/14/2020   PLT 292 03/14/2020      Component Value Date/Time   NA 135 03/14/2020 2214   K 3.6 03/14/2020 2214   CL 101 03/14/2020 2214   CO2 21 (L) 03/14/2020 2214   GLUCOSE 108 (H) 03/14/2020 2214   BUN 12 03/14/2020 2214   CREATININE 0.71 03/14/2020 2214   CALCIUM 9.5 03/14/2020 2214   PROT 8.2 (H) 03/14/2020 2214   ALBUMIN 4.8 03/14/2020 2214   AST 20 03/14/2020 2214   ALT 32 03/14/2020 2214   ALKPHOS  57 03/14/2020 2214   BILITOT 1.7 (H) 03/14/2020 2214   GFRNONAA >60 03/14/2020 2214   GFRAA >60 03/14/2020 2214   No results found for: CHOL, HDL, LDLCALC, LDLDIRECT, TRIG, CHOLHDL No results found for: JHER7E No results found for:  VITAMINB12 No results found for: TSH  No flowsheet data found.   No flowsheet data found.   ASSESSMENT AND PLAN  26 y.o. year old female  has a past medical history of Migraine. here with   Chronic migraine without aura with status migrainosus, not intractable  Nicole Francis reports migraines are stable off Ajovy. Unable to afford. She is having 2-3 migraines but reports they can last 2-3 days each. She will continue topiramate, rizatriptan and ondansetron as prescribed. I will add low dose amitriptyline 10mg  daily at bedtime. Side effects reviewed. She will call immediately with any concerns of worsening mood. She will call [facility] to inquire about patient assistance. She will continue close follow up with psychiatry. Contact number for Ms. Lord, NP provided, today. She is aware that pregnancy is not advised while on topiramate. She has nexplanon for contraception. She will return to see me in 4-6 months. She verbalizes understanding and agreement with this plan.    No orders of the defined types were placed in this encounter.     Meds ordered this encounter  Medications   amitriptyline (ELAVIL) 10 MG tablet    Sig: Take 1 tablet (10 mg total) by mouth at bedtime.    Dispense:  30 tablet    Refill:  3    Order Specific Question:   Supervising Provider    Answer:   Anson FretAHERN, ANTONIA B [[id number]]   topiramate (TOPAMAX) 50 MG tablet    Sig: Take 3 tablets (150 mg total) by mouth at bedtime.    Dispense:  270 tablet    Refill:  1    Order Specific Question:   Supervising Provider    Answer:   Anson FretAHERN, ANTONIA B [[id number]]   rizatriptan (MAXALT-MLT) 10 MG disintegrating tablet    Sig: Take 1 tablet (10 mg total) by mouth as needed for migraine. May repeat  in 2 hours if needed    Dispense:  9 tablet    Refill:  11    Order Specific Question:   Supervising Provider    Answer:   Anson FretAHERN, ANTONIA B [[id number]]   ondansetron (ZOFRAN-ODT) 4 MG disintegrating tablet    Sig: May take 4-8mg  every 8 hours for nausea    Dispense:  30 tablet    Refill:  3    Order Specific Question:   Supervising Provider    Answer:   Anson FretAHERN, ANTONIA B [[id number]]      Shawnie DapperAmy Jamarri Vuncannon, MSN, FNP-C 10/11/2021, 2:47 PM  [facility] [address], [address] [place], [place] [address] [phone]

## 2021-10-11 NOTE — Patient Instructions (Signed)
Below is our plan:  We will continue topiramate 150mg  daily at bedtime. We will try a very low dose of amitriptyline. Start 10mg  every night at bedtime. Please monitor mood closely and let me know if you have any changes in anxiety or depression. Please continue rizatriptan and ondansetron as needed but please limit use. Please follow up with financial assistance. DO NOT GET PREGNANT on these medicaitons. Please call me with any plans for pregnancy.   Please make sure you are staying well hydrated. I recommend 50-60 ounces daily. Well balanced diet and regular exercise encouraged. Consistent sleep schedule with 6-8 hours recommended.   Please continue follow up with care team as directed.   Follow up with me in 4-6 months   You may receive a survey regarding today's visit. I encourage you to leave honest feed back as I do use this information to improve patient care. Thank you for seeing me today!

## 2021-10-20 ENCOUNTER — Encounter: Payer: Self-pay | Admitting: Family Medicine

## 2021-10-22 ENCOUNTER — Other Ambulatory Visit (HOSPITAL_COMMUNITY): Payer: Self-pay | Admitting: Psychiatry

## 2021-10-23 ENCOUNTER — Other Ambulatory Visit (HOSPITAL_COMMUNITY): Payer: Self-pay | Admitting: Psychiatry

## 2021-10-23 MED ORDER — HYDROXYZINE HCL 25 MG PO TABS: 25.0000 mg | ORAL_TABLET | Freq: Every evening | ORAL | 2 refills | Status: DC | PRN

## 2021-10-23 NOTE — Progress Notes (Signed)
Hydroxyzine refilled.  Paydon Carll, PMHNP 

## 2021-10-25 ENCOUNTER — Telehealth (HOSPITAL_COMMUNITY): Payer: No Payment, Other

## 2021-11-16 ENCOUNTER — Telehealth (HOSPITAL_COMMUNITY): Payer: No Payment, Other | Admitting: Psychiatry

## 2021-11-23 ENCOUNTER — Encounter (HOSPITAL_COMMUNITY): Payer: Self-pay | Admitting: Psychiatry

## 2021-11-23 ENCOUNTER — Telehealth (INDEPENDENT_AMBULATORY_CARE_PROVIDER_SITE_OTHER): Payer: No Payment, Other | Admitting: Psychiatry

## 2021-11-23 DIAGNOSIS — F411 Generalized anxiety disorder: Secondary | ICD-10-CM

## 2021-11-23 MED ORDER — GABAPENTIN 100 MG PO CAPS: 200.0000 mg | ORAL_CAPSULE | Freq: Three times a day (TID) | ORAL | 2 refills | Status: DC

## 2021-11-23 MED ORDER — HYDROXYZINE HCL 25 MG PO TABS: 25.0000 mg | ORAL_TABLET | Freq: Every evening | ORAL | 2 refills | Status: DC | PRN

## 2021-11-23 NOTE — Progress Notes (Signed)
[facility] NP OP Progress Note  Patient Identification: Nicole Francis MRN:  [id number] Date of Evaluation:  11/23/2021 Referral Source: Beverly Sessions Chief Complaint:   Chief Complaint   Anxiety; Follow-up    Depression and anxiety Visit Diagnosis: General anxiety disorder  History of Present Illness:  26 y/o female presents today for f/u anxiety. Anxiety is moderate, "I've been able to manage it," with her gabapentin.  Her sleep is "good" with hydroxyzine.  Appetite fluctuates,no weight changes.  She is now engaged and excited that she and her boyfriend of 2.5 years are making this step.  No side effects from her medications.  No depression, suicidal/homicidal ideations, hallucinations, or substance use.  Past Psychiatric History: Panic d/o, social anxiety, PTSD, bipolar d/o, depression   Past Medical History:  Past Medical History:  Diagnosis Date   Migraine     Past Surgical History:  Procedure Laterality Date   OTHER SURGICAL HISTORY Left    Nexplanon     Family Psychiatric History: see below  Family History:  Family History  Problem Relation Age of Onset   Bipolar disorder Mother    High Cholesterol Mother    High blood pressure Mother    Migraines Mother    Sinusitis Mother    ADD / ADHD Brother     Social History:   Social History   Socioeconomic History   Marital status: Single    Spouse name: Not on file   Number of children: 0   Years of education: Not on file   Highest education level: Not on file  Occupational History   Not on file  Tobacco Use   Smoking status: Never   Smokeless tobacco: Never   Tobacco comments:    vapes nicotine  Substance and Sexual Activity   Alcohol use: No    Alcohol/week: 0.0 standard drinks   Drug use: No   Sexual activity: Yes    Birth control/protection: Implant  Other Topics Concern   Not on file  Social History Narrative   Nicole Francis graduated from high school. She plans on attending [employer] in the fall and majoring in  Psychology.   She lives with her adoptive grandmother and biological, maternal half-brother.       Caffeine: maybe 1 cup/week   Social Determinants of Health   Financial Resource Strain: Not on file  Food Insecurity: Not on file  Transportation Needs: Not on file  Physical Activity: Not on file  Stress: Not on file  Social Connections: Not on file    Additional Social History: lives with her boyfriend  Allergies:  No Known Allergies  Metabolic Disorder Labs: No results found for: HGBA1C, MPG No results found for: PROLACTIN No results found for: CHOL, TRIG, HDL, CHOLHDL, VLDL, LDLCALC No results found for: TSH  Therapeutic Level Labs: No results found for: LITHIUM No results found for: CBMZ No results found for: VALPROATE  Current Medications: Current Outpatient Medications  Medication Sig Dispense Refill   amitriptyline (ELAVIL) 10 MG tablet Take 1 tablet (10 mg total) by mouth at bedtime. 30 tablet 3   gabapentin (NEURONTIN) 100 MG capsule Take 2 capsules (200 mg total) by mouth 3 (three) times daily. Sig is 200 mg three times a day, no allowance for prn for this medicine 180 capsule 2   hydrOXYzine (ATARAX) 25 MG tablet TAKE 1 TABLET BY MOUTH AT BEDTIME AS NEEDED FOR SLEEP 30 tablet 2   hydrOXYzine (ATARAX) 25 MG tablet Take 1 tablet (25 mg total) by mouth at  bedtime as needed (sleep). 30 tablet 2   ondansetron (ZOFRAN-ODT) 4 MG disintegrating tablet May take 4-8mg  every 8 hours for nausea 30 tablet 3   oxymetazoline (AFRIN) 0.05 % nasal spray Place 1 spray into both nostrils 2 (two) times daily as needed for congestion.     rizatriptan (MAXALT-MLT) 10 MG disintegrating tablet Take 1 tablet (10 mg total) by mouth as needed for migraine. May repeat in 2 hours if needed 9 tablet 11   topiramate (TOPAMAX) 50 MG tablet Take 3 tablets (150 mg total) by mouth at bedtime. 270 tablet 1   No current facility-administered medications for this visit.    Musculoskeletal: Strength  & Muscle Tone: within normal limits Gait & Station: normal Patient leans: N/A  Psychiatric Specialty Exam: Review of Systems  Psychiatric/Behavioral:  The patient is nervous/anxious.   All other systems reviewed and are negative.  There were no vitals taken for this visit.There is no height or weight on file to calculate BMI.  General Appearance: Casual  Eye Contact:  Good  Speech:  Clear and Coherent  Volume:  Normal  Mood:  Anxiety  Affect:  Congruent  Thought Process:  Coherent and Descriptions of Associations: Intact  Orientation:  Full (Time, Place, and Person)  Thought Content:  WDL and Logical  Suicidal Thoughts:  No  Homicidal Thoughts:  No  Memory:  Immediate;   Good Recent;   Good Remote;   Good  Judgement:  Good  Insight:  Good  Psychomotor Activity:  Good  Concentration:  Concentration: Fair and Attention Span: Fair  Recall:  Good  Fund of Knowledge:Good  Language: Good  Akathisia:  No  Handed:  Right  AIMS (if indicated):  not done  Assets:  Housing Leisure Time Resilience Social Support  ADL's:  Intact  Cognition: WNL  Sleep:  Good   Screenings: GAD-7    Flowsheet Row Video Visit [facility] 08/11/2020 in [facility]  Total GAD-7 Score 18      PHQ2-9    Flowsheet Row Video Visit from 11/16/2020 in [facility]  PHQ-2 Total Score 1      Flowsheet Row Video Visit from 11/16/2020 in [place] No Risk     GAD of 18  Assessment and Plan:  General anxiety disorder: Continue gabapentin 200 mg TID -Follow up in 3 months -F/up with therapy  Insomnia: -continue hydroxyzine 25 mg at bedtime PRN, may repeat once in an hour if not asleep  Virtual Visit via Video Note  I connected with Nicole Francis on 11/23/21 at 10:00 AM EST by a video enabled telemedicine application and verified that I am speaking with the correct person using two  identifiers.  Location: Patient: Home Provider: Home office   I discussed the limitations of evaluation and management by telemedicine and the availability of in person appointments. The patient expressed understanding and agreed to proceed.  Follow Up Instructions: Follow up in 3 months   I discussed the assessment and treatment plan with the patient. The patient was provided an opportunity to ask questions and all were answered. The patient agreed with the plan and demonstrated an understanding of the instructions.   The patient was advised to call back or seek an in-person evaluation if the symptoms worsen or if the condition fails to improve as anticipated.  I provided 20 minutes of non-face-to-face time during this encounter.   Waylan Boga, NP   Theodoro Clock  Sreshta Cressler, NP 2/28/202310:02 AM

## 2022-02-15 ENCOUNTER — Telehealth (INDEPENDENT_AMBULATORY_CARE_PROVIDER_SITE_OTHER): Payer: No Payment, Other | Admitting: Psychiatry

## 2022-02-15 ENCOUNTER — Encounter (HOSPITAL_COMMUNITY): Payer: Self-pay | Admitting: Psychiatry

## 2022-02-15 DIAGNOSIS — F411 Generalized anxiety disorder: Secondary | ICD-10-CM | POA: Diagnosis not present

## 2022-02-15 MED ORDER — GABAPENTIN 100 MG PO CAPS: 200.0000 mg | ORAL_CAPSULE | Freq: Three times a day (TID) | ORAL | 0 refills | Status: DC

## 2022-02-15 MED ORDER — TRAZODONE HCL 50 MG PO TABS: 25.0000 mg | ORAL_TABLET | Freq: Every day | ORAL | 0 refills | Status: DC

## 2022-02-15 MED ORDER — HYDROXYZINE HCL 25 MG PO TABS: 25.0000 mg | ORAL_TABLET | Freq: Every evening | ORAL | 2 refills | Status: AC | PRN

## 2022-02-15 NOTE — Progress Notes (Signed)
BH NP OP Progress Note  Patient Identification: Nicole Francis MRN:  [id number] Date of Evaluation:  02/15/2022  Chief Complaint:   Chief Complaint   Anxiety; Follow-up; Depression     Visit Diagnosis: General anxiety disorder  History of Present Illness:  26 y/o female presents today for f/u anxiety. Anxiety is moderate related to getting married recently and family issues on both sides.  Things have improved after the wedding in March.  Lot of stressors happening with house projects, weddings, and school starting in the fall with their toddler (pre-K).  Her sleep is interrupted with an earlier awakening.  Discussed medications and decided on Trazodone 25 mg daily.  Appetite is fair, no weight changes.   No depression, suicidal/homicidal ideations, hallucinations, or substance use.  Past Psychiatric History: Panic d/o, social anxiety, PTSD, bipolar d/o, depression   Past Medical History:  Past Medical History:  Diagnosis Date   Migraine     Past Surgical History:  Procedure Laterality Date   OTHER SURGICAL HISTORY Left    Nexplanon     Family Psychiatric History: see below  Family History:  Family History  Problem Relation Age of Onset   Bipolar disorder Mother    High Cholesterol Mother    High blood pressure Mother    Migraines Mother    Sinusitis Mother    ADD / ADHD Brother     Social History:   Social History   Socioeconomic History   Marital status: Single    Spouse name: Not on file   Number of children: 0   Years of education: Not on file   Highest education level: Not on file  Occupational History   Not on file  Tobacco Use   Smoking status: Never   Smokeless tobacco: Never   Tobacco comments:    vapes nicotine  Substance and Sexual Activity   Alcohol use: No    Alcohol/week: 0.0 standard drinks   Drug use: No   Sexual activity: Yes    Birth control/protection: Implant  Other Topics Concern   Not on file  Social History Narrative    Nicole Francis graduated from high school. She plans on attending [employer] in the fall and majoring in Psychology.   She lives with her adoptive grandmother and biological, maternal half-brother.       Caffeine: maybe 1 cup/week   Social Determinants of Health   Financial Resource Strain: Not on file  Food Insecurity: Not on file  Transportation Needs: Not on file  Physical Activity: Not on file  Stress: Not on file  Social Connections: Not on file    Additional Social History: lives with her husband and his child  Allergies:  No Known Allergies  Metabolic Disorder Labs: No results found for: HGBA1C, MPG No results found for: PROLACTIN No results found for: CHOL, TRIG, HDL, CHOLHDL, VLDL, LDLCALC No results found for: TSH  Therapeutic Level Labs: No results found for: LITHIUM No results found for: CBMZ No results found for: VALPROATE  Current Medications: Current Outpatient Medications  Medication Sig Dispense Refill   amitriptyline (ELAVIL) 10 MG tablet Take 1 tablet (10 mg total) by mouth at bedtime. 30 tablet 3   gabapentin (NEURONTIN) 100 MG capsule Take 2 capsules (200 mg total) by mouth 3 (three) times daily. Sig is 200 mg three times a day, no allowance for prn for this medicine 180 capsule 2   hydrOXYzine (ATARAX) 25 MG tablet TAKE 1 TABLET BY MOUTH AT BEDTIME AS NEEDED FOR SLEEP  30 tablet 2   hydrOXYzine (ATARAX) 25 MG tablet Take 1 tablet (25 mg total) by mouth at bedtime as needed (sleep). 30 tablet 2   ondansetron (ZOFRAN-ODT) 4 MG disintegrating tablet May take 4-8mg  every 8 hours for nausea 30 tablet 3   oxymetazoline (AFRIN) 0.05 % nasal spray Place 1 spray into both nostrils 2 (two) times daily as needed for congestion.     rizatriptan (MAXALT-MLT) 10 MG disintegrating tablet Take 1 tablet (10 mg total) by mouth as needed for migraine. May repeat in 2 hours if needed 9 tablet 11   topiramate (TOPAMAX) 50 MG tablet Take 3 tablets (150 mg total) by mouth at bedtime. 270  tablet 1   No current facility-administered medications for this visit.    Musculoskeletal: Strength & Muscle Tone: within normal limits Gait & Station: normal Patient leans: N/A  Psychiatric Specialty Exam: Review of Systems  Psychiatric/Behavioral:  The patient is nervous/anxious.   All other systems reviewed and are negative.  There were no vitals taken for this visit.There is no height or weight on file to calculate BMI.  General Appearance: Casual  Eye Contact:  Good  Speech:  Clear and Coherent  Volume:  Normal  Mood:  Anxiety  Affect:  Congruent  Thought Process:  Coherent and Descriptions of Associations: Intact  Orientation:  Full (Time, Place, and Person)  Thought Content:  WDL and Logical  Suicidal Thoughts:  No  Homicidal Thoughts:  No  Memory:  Immediate;   Good Recent;   Good Remote;   Good  Judgement:  Good  Insight:  Good  Psychomotor Activity:  Good  Concentration:  Concentration: Fair and Attention Span: Fair  Recall:  Good  Fund of Knowledge:Good  Language: Good  Akathisia:  No  Handed:  Right  AIMS (if indicated):  not done  Assets:  Housing Leisure Time Resilience Social Support  ADL's:  Intact  Cognition: WNL  Sleep:  Good   Screenings: GAD-7    Flowsheet Row Video Visit from 08/11/2020 in [facility]  Total GAD-7 Score 18      PHQ2-9    Flowsheet Row Video Visit from 11/16/2020 in [facility]  PHQ-2 Total Score 1      Flowsheet Row Video Visit from 11/16/2020 in [facility]  C-SSRS RISK CATEGORY No Risk     GAD of 18  Assessment and Plan:  General anxiety disorder: Continue gabapentin 200 mg TID -Follow up in 3 months -F/up with therapy  Insomnia: -continue hydroxyzine 25 mg at bedtime PRN, may repeat once in an hour if not asleep  Virtual Visit via Video Note  I connected with Nicole Francis on 02/15/22 at 10:00 AM EDT by a  video enabled telemedicine application and verified that I am speaking with the correct person using two identifiers.  Location: Patient: Home Provider: Home office   I discussed the limitations of evaluation and management by telemedicine and the availability of in person appointments. The patient expressed understanding and agreed to proceed.  Follow Up Instructions: Follow up in 3 months   I discussed the assessment and treatment plan with the patient. The patient was provided an opportunity to ask questions and all were answered. The patient agreed with the plan and demonstrated an understanding of the instructions.   The patient was advised to call back or seek an in-person evaluation if the symptoms worsen or if the condition fails to improve as anticipated.  I provided 20 minutes of non-face-to-face time during this encounter.   Nanine MeansJamison Shwanda Soltis, NP   Nanine MeansJamison Arielis Leonhart, NP 5/23/202310:13 AM

## 2022-02-16 NOTE — Progress Notes (Unsigned)
No chief complaint on file.    HISTORY OF PRESENT ILLNESS:  02/16/22 ALL:  Nicole Francis returns for follow up for migraines. We continued topiramate 150mg  daily and added amitriptyline 10mg  at last visit 09/2021. Rizatriptan and ondansetron continued for abortive therapy. She sent Mychart message reporting intolerance and worsening anxiety to amitriptyline. She was encouraged to call psychiatry. She is now taking gabapentin, trazodone and hydroxyzine for mood management.   10/11/2021 ALL:  10/2021 returns for follow up for migraines. She continues topiramate 150mg  daily. Ajovy last in September. She did not follow up with financial assistance. Headaches are stable. No significant change. She is having 12-16 headache days with 2-3 migrainous headaches a month.  Rizatriptan and ondansetron help with abortive therapy. She also uses Tylenol OTC, usually 2-3 times a week. She is uninsured. Unemployed. She was recently engaged. She continues to see psychiatry regularly. She feels mood is stable.    06/02/2021 ALL: returns for follow up for migraine headaches. She continues topiramate 150mg  at bedtime. We added Ajovy injections at last visit visit. She reports headaches significantly improved. She did not have any headaches in June and only one mild headache in July and August. Rizatripan and ondansetron aid in abortive therapy. She also continues gabapentin 200mg  TID for anxiety with psychiatry. Nausea was much better in June and July with very little vomiting but reports nausea returned in August. She has gained 6 pounds.   01/27/2021 ALL:  Nicole Francis is a 26 y.o. female here today for follow up for migraines. She continues topiramate 150mg  at bedtime. She has continued rizatriptan with Tylenol that seems to help some. She is unsure if Nurtec helped. Gabapentin was increased to 200mg  TID by psychiatry. She feels this has helped with mood but not much with headaches. She continues to have 2-3  headaches a week, most are migrainous. She is taking rizatriptan at least 2 times a month. She has not noted any specific side effects with taking topiramate but reports losing about 20 pounds. Weight loss confirmed with review of chart. Unclear over what period of time. She reports vomiting "everything" she eats once a week. Most of these days she has a headache. There have been a few episodes where she vomits and does not have a headache. She is using ondansetron but does not usually repeat dose. She reports submitting paperwork for patient assistance. She can not tell me when. She has not followed up.   HISTORY (copied from August previous note)  10/13/20:   Ms. Lipson is a 26 year old female with a history of migraine headaches.  She reports that she continues to get 1-2 headaches a week.  She reports that some of these headaches are dull headaches for which she just takes Tylenol.  There are others that are more severe and she does use the rizatriptan.  She states that typically when she takes the rizatriptan her headache will resolve in 1 hour but then it may come back.  At the last visit she was given samples of Nurtec but she "does not know what she did with them."  It seems that she never tried this medication.  The patient continues on Topamax 150 mg at bedtime.  She recently was put on gabapentin 100 mg 3 times a day for anxiety and bipolar per the patient.  She returns today for an evaluation.   HISTORY Interval history: migraines better but not great. We discussed options. She "forgot" to email me and also "forgot"  to seek out [facility] financial assistance. We will increae topiramate.   HPI:  Nicole Francis is a 26 y.o. female here as requested by ED for migraines. Migraines since the age of 88. She tried vitamin b and magnesium and didn't help. Can be the entire top of the head, sometimes the sides of the head, pulsating/pounding/throbbing,nausea,light and sound sensitivity,  movement makes it work, skin sensitivity as well. Mom has migraines. Migraines can last 24 hours to several weeks on and off. The last few days she has been very sick because of a migraine and now she is feeling better. She does not take OTC meds, maybe tylenol. No aura. She gets dizzy, lightheaded, cold or hot. Over the last few years they have worsened in frequency and some have been more severe, quality the same, she wakes with headaches, she can have blurry vision, nor positional or exertional. Tylenol may help, unknown triggers, she has a good routine, she sleeps well, eats well, exercise. Stays hydrated. She has a lot of nausea.    Reviewed notes, labs and imaging from outside physicians, which showed:   Cbc nml, cmp unremarkab;e BUN 12 and cr 0.71   MRI of the brain 07/2015    There is no evidence of acute infarct, intracranial hemorrhage, mass, midline shift, or extra-axial fluid collection. Ventricles and sulci are normal. Brain is normal in signal.   Orbits are unremarkable. There is mild bilateral ethmoid, sphenoid, and maxillary sinus mucosal thickening with small fluid levels in the left greater than right maxillary sinuses. Mastoid air cells clear. Major intracranial vascular flow voids are preserved.   IMPRESSION: 1. Unremarkable appearance of the brain. 2. Paranasal sinus mucosal thickening and fluid. Correlate for acute sinusitis.   REVIEW OF SYSTEMS: Out of a complete 14 system review of symptoms, the patient complains only of the following symptoms, headaches, anxiety, vomiting, and all other reviewed systems are negative.   ALLERGIES: No Known Allergies   HOME MEDICATIONS: Outpatient Medications Prior to Visit  Medication Sig Dispense Refill   gabapentin (NEURONTIN) 100 MG capsule Take 2 capsules (200 mg total) by mouth 3 (three) times daily. Sig is 200 mg three times a day, no allowance for prn for this medicine 540 capsule 0   hydrOXYzine (ATARAX) 25 MG tablet  TAKE 1 TABLET BY MOUTH AT BEDTIME AS NEEDED FOR SLEEP 30 tablet 2   hydrOXYzine (ATARAX) 25 MG tablet Take 1 tablet (25 mg total) by mouth at bedtime as needed (sleep). 30 tablet 2   ondansetron (ZOFRAN-ODT) 4 MG disintegrating tablet May take 4-8mg  every 8 hours for nausea 30 tablet 3   oxymetazoline (AFRIN) 0.05 % nasal spray Place 1 spray into both nostrils 2 (two) times daily as needed for congestion.     rizatriptan (MAXALT-MLT) 10 MG disintegrating tablet Take 1 tablet (10 mg total) by mouth as needed for migraine. May repeat in 2 hours if needed 9 tablet 11   topiramate (TOPAMAX) 50 MG tablet Take 3 tablets (150 mg total) by mouth at bedtime. 270 tablet 1   traZODone (DESYREL) 50 MG tablet Take 0.5 tablets (25 mg total) by mouth at bedtime. 45 tablet 0   No facility-administered medications prior to visit.     PAST MEDICAL HISTORY: Past Medical History:  Diagnosis Date   Migraine      PAST SURGICAL HISTORY: Past Surgical History:  Procedure Laterality Date   OTHER SURGICAL HISTORY Left    Nexplanon      FAMILY HISTORY: Family  History  Problem Relation Age of Onset   Bipolar disorder Mother    High Cholesterol Mother    High blood pressure Mother    Migraines Mother    Sinusitis Mother    ADD / ADHD Brother      SOCIAL HISTORY: Social History   Socioeconomic History   Marital status: Single    Spouse name: Not on file   Number of children: 0   Years of education: Not on file   Highest education level: Not on file  Occupational History   Not on file  Tobacco Use   Smoking status: Never   Smokeless tobacco: Never   Tobacco comments:    vapes nicotine  Substance and Sexual Activity   Alcohol use: No    Alcohol/week: 0.0 standard drinks   Drug use: No   Sexual activity: Yes    Birth control/protection: Implant  Other Topics Concern   Not on file  Social History Narrative   Nicole Francis graduated from high school. She plans on attending [employer] in the fall and  majoring in Psychology.   She lives with her adoptive grandmother and biological, maternal half-brother.       Caffeine: maybe 1 cup/week   Social Determinants of Health   Financial Resource Strain: Not on file  Food Insecurity: Not on file  Transportation Needs: Not on file  Physical Activity: Not on file  Stress: Not on file  Social Connections: Not on file  Intimate Partner Violence: Not on file      PHYSICAL EXAM  There were no vitals filed for this visit.    There is no height or weight on file to calculate BMI.   Generalized: Well developed, in no acute distress  Cardiology: normal rate and rhythm, no murmur auscultated  Respiratory: clear to auscultation bilaterally    Neurological examination  Mentation: Alert oriented to time, place, history taking. Follows all commands speech and language fluent Cranial nerve II-XII: Pupils were equal round reactive to light. Extraocular movements were full, visual field were full on confrontational test. Facial sensation and strength were normal. Head turning and shoulder shrug  were normal and symmetric. Motor: The motor testing reveals 5 over 5 strength of all 4 extremities. Good symmetric motor tone is noted throughout.  Gait and station: Gait is normal.     DIAGNOSTIC DATA (LABS, IMAGING, TESTING) - I reviewed patient records, labs, notes, testing and imaging myself where available.  Lab Results  Component Value Date   WBC 10.3 03/14/2020   HGB 13.2 03/14/2020   HCT 41.2 03/14/2020   MCV 96.3 03/14/2020   PLT 292 03/14/2020      Component Value Date/Time   NA 135 03/14/2020 2214   K 3.6 03/14/2020 2214   CL 101 03/14/2020 2214   CO2 21 (L) 03/14/2020 2214   GLUCOSE 108 (H) 03/14/2020 2214   BUN 12 03/14/2020 2214   CREATININE 0.71 03/14/2020 2214   CALCIUM 9.5 03/14/2020 2214   PROT 8.2 (H) 03/14/2020 2214   ALBUMIN 4.8 03/14/2020 2214   AST 20 03/14/2020 2214   ALT 32 03/14/2020 2214   ALKPHOS 57  03/14/2020 2214   BILITOT 1.7 (H) 03/14/2020 2214   GFRNONAA >60 03/14/2020 2214   GFRAA >60 03/14/2020 2214   No results found for: CHOL, HDL, LDLCALC, LDLDIRECT, TRIG, CHOLHDL No results found for: ZOXW9UHGBA1C No results found for: VITAMINB12 No results found for: TSH      View : No data to display.  View : No data to display.           ASSESSMENT AND PLAN  26 y.o. year old female  has a past medical history of Migraine. here with   Chronic migraine without aura with status migrainosus, not intractable  Lynnda reports migraines are stable off Ajovy. Unable to afford. She is having 2-3 migraines but reports they can last 2-3 days each. She will continue topiramate, rizatriptan and ondansetron as prescribed. I will add low dose amitriptyline 10mg  daily at bedtime. Side effects reviewed. She will call immediately with any concerns of worsening mood. She will call [place] to inquire about patient assistance. She will continue close follow up with psychiatry. Contact number for Ms. Lord, NP provided, today. She is aware that pregnancy is not advised while on topiramate. She has nexplanon for contraception. She will return to see me in 4-6 months. She verbalizes understanding and agreement with this plan.    No orders of the defined types were placed in this encounter.     No orders of the defined types were placed in this encounter.     , MSN, FNP-C 02/16/2022, 1:58 PM  [facility] [address], [address] [place], [place] [phone]

## 2022-02-16 NOTE — Patient Instructions (Incomplete)

## 2022-02-17 ENCOUNTER — Telehealth: Payer: BLUE CROSS/BLUE SHIELD | Admitting: Family Medicine

## 2022-02-17 ENCOUNTER — Ambulatory Visit: Payer: BLUE CROSS/BLUE SHIELD | Admitting: Family Medicine

## 2022-02-17 ENCOUNTER — Telehealth: Payer: Self-pay | Admitting: Family Medicine

## 2022-02-17 DIAGNOSIS — G43701 Chronic migraine without aura, not intractable, with status migrainosus: Secondary | ICD-10-CM

## 2022-02-17 NOTE — Telephone Encounter (Signed)
..   Pt understands that although there may be some limitations with this type of visit, we will take all precautions to reduce any security or privacy concerns.  Pt understands that this will be treated like an in office visit and we will file with pt's insurance, and there may be a patient responsible charge related to this service. ? ?

## 2022-02-22 NOTE — Patient Instructions (Signed)
Below is our plan:  We will continue topiramate 150mg  daily and rizatriptan as needed. Make sure to take your rizatriptan as soon as you feel a migraine starting. Hydroxidi can also help with nausea but will likely make you sleepy.   Please make sure you are staying well hydrated. I recommend 50-60 ounces daily. Well balanced diet and regular exercise encouraged. Consistent sleep schedule with 6-8 hours recommended.   Please continue follow up with care team as directed.   Follow up with me in 1 year  You may receive a survey regarding today's visit. I encourage you to leave honest feed back as I do use this information to improve patient care. Thank you for seeing me today!

## 2022-02-22 NOTE — Progress Notes (Unsigned)
PATIENT: Nicole Francis DOB: 05/17/1996  REASON FOR VISIT: follow up HISTORY FROM: patient  Virtual Visit via Telephone Note  I connected with Nicole Francis on 02/23/22 at  7:15 AM EDT by telephone and verified that I am speaking with the correct person using two identifiers.   I discussed the limitations, risks, security and privacy concerns of performing an evaluation and management service by telephone and the availability of in person appointments. I also discussed with the patient that there may be a patient responsible charge related to this service. The patient expressed understanding and agreed to proceed.   History of Present Illness:  02/22/22 ALL ([place]):  Nicole Francis returns for follow up for migraines. We continued topiramate 150mg  daily and added amitriptyline 10mg  at last visit 09/2021. Rizatriptan and ondansetron continued for abortive therapy. She sent Mychart message reporting intolerance and worsening anxiety to amitriptyline. She was encouraged to call psychiatry. She is now taking gabapentin, trazodone (hasn't started yet) and hydroxyzine for mood management. Since, she reports having about 4-6 headaches per month with 1 migraine on average per month. She takes Tylenol for milder headaches. Rizatriptan usually works but she has not filled since January. Ondansetron has not helped with nausea. In fact, she reports that she vomits every time she takes it. She does not have a PCP.   10/11/2021 ALL:  Nicole Francis returns for follow up for migraines. She continues topiramate 150mg  daily. Ajovy last in September. She did not follow up with financial assistance. Headaches are stable. No significant change. She is having 12-16 headache days with 2-3 migrainous headaches a month.  Rizatriptan and ondansetron help with abortive therapy. She also uses Tylenol OTC, usually 2-3 times a week. She is uninsured. Unemployed. She was recently engaged. She continues to see psychiatry  regularly. She feels mood is stable.    06/02/2021 ALL: Nicole Francis returns for follow up for migraine headaches. She continues topiramate 150mg  at bedtime. We added Ajovy injections at last visit visit. She reports headaches significantly improved. She did not have any headaches in June and only one mild headache in July and August. Rizatripan and ondansetron aid in abortive therapy. She also continues gabapentin 200mg  TID for anxiety with psychiatry. Nausea was much better in June and July with very little vomiting but reports nausea returned in August. She has gained 6 pounds.   01/27/2021 ALL:  Nicole Francis is a 26 y.o. female here today for follow up for migraines. She continues topiramate 150mg  at bedtime. She has continued rizatriptan with Tylenol that seems to help some. She is unsure if Nurtec helped. Gabapentin was increased to 200mg  TID by psychiatry. She feels this has helped with mood but not much with headaches. She continues to have 2-3 headaches a week, most are migrainous. She is taking rizatriptan at least 2 times a month. She has not noted any specific side effects with taking topiramate but reports losing about 20 pounds. Weight loss confirmed with review of chart. Unclear over what period of time. She reports vomiting "everything" she eats once a week. Most of these days she has a headache. There have been a few episodes where she vomits and does not have a headache. She is using ondansetron but does not usually repeat dose. She reports submitting paperwork for patient assistance. She can not tell me when. She has not followed up.   HISTORY (copied from Nicole Francis previous note)  10/13/20 Nicole Francis is a 26 year old female with a history of migraine headaches.  She reports that she continues to get 1-2 headaches a week.  She reports that some of these headaches are dull headaches for which she just takes Tylenol.  There are others that are more severe and she does use the  rizatriptan.  She states that typically when she takes the rizatriptan her headache will resolve in 1 hour but then it may come back.  At the last visit she was given samples of Nurtec but she "does not know what she did with them."  It seems that she never tried this medication.  The patient continues on Topamax 150 mg at bedtime.  She recently was put on gabapentin 100 mg 3 times a day for anxiety and bipolar per the patient.  She returns today for an evaluation.   HISTORY Interval history: migraines better but not great. We discussed options. She "forgot" to email me and also "forgot" to seek out [facility] financial assistance. We will increae topiramate.   HPI:  Nicole Francis is a 26 y.o. female here as requested by ED for migraines. Migraines since the age of 24. She tried vitamin b and magnesium and didn't help. Can be the entire top of the head, sometimes the sides of the head, pulsating/pounding/throbbing,nausea,light and sound sensitivity, movement makes it work, skin sensitivity as well. Mom has migraines. Migraines can last 24 hours to several weeks on and off. The last few days she has been very sick because of a migraine and now she is feeling better. She does not take OTC meds, maybe tylenol. No aura. She gets dizzy, lightheaded, cold or hot. Over the last few years they have worsened in frequency and some have been more severe, quality the same, she wakes with headaches, she can have blurry vision, nor positional or exertional. Tylenol may help, unknown triggers, she has a good routine, she sleeps well, eats well, exercise. Stays hydrated. She has a lot of nausea.    Reviewed notes, labs and imaging from outside physicians, which showed:   Cbc nml, cmp unremarkab;e BUN 12 and cr 0.71   MRI of the brain 07/2015    There is no evidence of acute infarct, intracranial hemorrhage, mass, midline shift, or extra-axial fluid collection. Ventricles and sulci are normal. Brain is normal in  signal.   Orbits are unremarkable. There is mild bilateral ethmoid, sphenoid, and maxillary sinus mucosal thickening with small fluid levels in the left greater than right maxillary sinuses. Mastoid air cells clear. Major intracranial vascular flow voids are preserved.   IMPRESSION: 1. Unremarkable appearance of the brain. 2. Paranasal sinus mucosal thickening and fluid. Correlate for acute sinusitis.  Observations/Objective:  Generalized: Well developed, in no acute distress  Mentation: Alert oriented to time, place, history taking. Follows all commands speech and language fluent   Assessment and Plan:  26 y.o. year old female  has a past medical history of Migraine. here with    ICD-10-CM   1. Chronic migraine without aura with status migrainosus, not intractable  G43.701 Ambulatory referral to [facility]    2. Nausea  R11.0 Ambulatory referral to [facility]    3. Does not have primary care provider  Z75.8 Ambulatory referral to [facility]    4. Anxiety  F41.9       Nicole Francis reports that migraines have improved since last being seen. She was previously having 12-16 headache days a month with 2-3 migraines, now having 4-6 headache days with 1 migraine. She has not taken rizatriptan since January. She  did not know she had refills. We have discussed need to take abortive meds at onset of migraine. She will continue topiramate 150mg  daily and rizatriptan as needed. She will continue close follow up with psychiatry. She was encouraged to establish care with PCP. Referral placed, today. She will focus on healthy lifestyle habits and follow up with me in 1 year, sooner if needed.   Orders Placed This Encounter  Procedures   Ambulatory referral to Family Practice    Referral Priority:   Routine    Referral Type:   Consultation    Referral Reason:   Specialty Services Required    Requested Specialty:   Family Medicine    Number of Visits Requested:   1    No orders  of the defined types were placed in this encounter.    Follow Up Instructions:  I discussed the assessment and treatment plan with the patient. The patient was provided an opportunity to ask questions and all were answered. The patient agreed with the plan and demonstrated an understanding of the instructions.   The patient was advised to call back or seek an in-person evaluation if the symptoms worsen or if the condition fails to improve as anticipated.  I provided 20 minutes of non-face-to-face time during this encounter. Patient located at their place of residence during [place] visit. Provider is in the office.    Debbora Presto, NP

## 2022-02-23 ENCOUNTER — Telehealth: Payer: Self-pay | Admitting: Family Medicine

## 2022-02-23 ENCOUNTER — Encounter: Payer: Self-pay | Admitting: Family Medicine

## 2022-02-23 ENCOUNTER — Telehealth (INDEPENDENT_AMBULATORY_CARE_PROVIDER_SITE_OTHER): Payer: Commercial Managed Care - PPO | Admitting: Family Medicine

## 2022-02-23 DIAGNOSIS — Z758 Other problems related to medical facilities and other health care: Secondary | ICD-10-CM

## 2022-02-23 DIAGNOSIS — G43701 Chronic migraine without aura, not intractable, with status migrainosus: Secondary | ICD-10-CM

## 2022-02-23 DIAGNOSIS — R110 Nausea: Secondary | ICD-10-CM

## 2022-02-23 DIAGNOSIS — F419 Anxiety disorder, unspecified: Secondary | ICD-10-CM

## 2022-02-23 NOTE — Telephone Encounter (Signed)
Referral for PCP sent to [facility] [phone].

## 2022-02-24 ENCOUNTER — Encounter: Payer: Self-pay | Admitting: *Deleted

## 2022-03-09 ENCOUNTER — Ambulatory Visit (INDEPENDENT_AMBULATORY_CARE_PROVIDER_SITE_OTHER): Payer: Commercial Managed Care - PPO | Admitting: Family Medicine

## 2022-03-09 ENCOUNTER — Encounter: Payer: Self-pay | Admitting: Family Medicine

## 2022-03-09 VITALS — BP 103/75 | HR 74 | Ht 60.0 in | Wt 116.8 lb

## 2022-03-09 DIAGNOSIS — F411 Generalized anxiety disorder: Secondary | ICD-10-CM | POA: Diagnosis not present

## 2022-03-09 DIAGNOSIS — Z975 Presence of (intrauterine) contraceptive device: Secondary | ICD-10-CM | POA: Diagnosis not present

## 2022-03-09 DIAGNOSIS — Z0000 Encounter for general adult medical examination without abnormal findings: Secondary | ICD-10-CM

## 2022-03-09 DIAGNOSIS — R1110 Vomiting, unspecified: Secondary | ICD-10-CM

## 2022-03-09 DIAGNOSIS — G43701 Chronic migraine without aura, not intractable, with status migrainosus: Secondary | ICD-10-CM | POA: Diagnosis not present

## 2022-03-09 NOTE — Assessment & Plan Note (Signed)
Symptoms do not seem to be associated solely with menstrual cycles.  Unclear etiology of symptoms, do not feel that there is a neuropathic component to this at this time.  Consider somatic symptoms but do not want to incur on this.  At this time, do not feel that specific GI work-up is warranted but if persistent over the next several months or not improving with treatment will consider this. - Trialing Atarax for symptom relief as needed - Consider Zofran ODT as needed if episodes persist

## 2022-03-09 NOTE — Patient Instructions (Signed)
Everything looks great today, I do not need any blood work.  We will need to come back for couple of visits in the next several months to follow-up on all of your other concerns.  When you go to the front desk, make sure to make an appointment to get your Nexplanon removed and reinserted.  We will also need to make an appointment for your Pap smear, I recommend talking with your therapist/psychologist about concerns you have.  Make the appointment when you feel comfortable and if you have any concerns at that time let me know.  If there are anything like trigger words or movements that we will upset you please let us know before hand.

## 2022-03-09 NOTE — Assessment & Plan Note (Signed)
Was placed in 2018, needs removal and reinsertion. - Patient to schedule appointment for procedural visit - Counseled on other versions of contraception, patient wishes to continue with Nexplanon

## 2022-03-09 NOTE — Assessment & Plan Note (Signed)
Currently being managed by neurology.  Is overall stable.  Has about 4-5 migraines per month (improved from 12 previously) - Continue to follow with neurology - Continue topiramate 150 mg nightly - Continue rizatriptan 10 mg as needed

## 2022-03-09 NOTE — Assessment & Plan Note (Signed)
Following with psychiatry, getting into counseling actively.  PHQ-9 score of 10.  Patient feels that she is mostly well controlled - Continue trazodone 50 mg nightly as needed - Continue hydroxyzine nightly as needed - Continue gabapentin 200 mg 3 times daily

## 2022-03-09 NOTE — Assessment & Plan Note (Signed)
Patient has never had a Pap smear.  Has history of trauma, will need to be very delicate regarding this.  Educated patient extensively on procedure and recommend following up with therapist for counseling. - Patient instructed to schedule Pap smear when she feels appropriate - We will complete chlamydia screening at that time as well  -Hepatitis C screening and HIV screening with next blood draw

## 2022-03-09 NOTE — Progress Notes (Signed)
Subjective:    Patient ID: Nicole Francis, female    DOB: 01-17-1996, 26 y.o.   MRN: [id number]   CC: New Patient  HPI:  Patient has multiple complaints listed on her phone that she would like to talk about at later visits.  Nexplanon removal and reinsertion: Patient reports that she had a Nexplanon placed in 2018, she is aware that it is no longer active and they are currently using condoms for birth control.  Would like to have this replaced.  History of trauma: patient very concerned about needing a pap smear for cervical cancer screening to due trauma from when she was younger. She is going to schedule with a counselor soon and was advised to discuss this with the counselor as well.  Migraines: This with neurology.  Is currently well controlled with Maxalt and topiramate.  General anxiety disorder: Patient follows with a psychiatrist and is currently stable on gabapentin, trazodone, hydroxyzine.  Patient also reports concerns with intermittent episodes that occur monthly of persistent nausea and vomiting that would last a few days to a week.  They are sometimes associated with her menstrual cycles but not always.  Has no known triggers, is unable to keep food or drink down during these times.  Has previously tried to go to the ER but was previously not covered by insurance and the visits are very expensive.   PMHx: Past Medical History:  Diagnosis Date   Migraine      Surgical Hx: Past Surgical History:  Procedure Laterality Date   OTHER SURGICAL HISTORY Left    Nexplanon      Family Hx: Family History  Problem Relation Age of Onset   Bipolar disorder Mother    High Cholesterol Mother    High blood pressure Mother    Migraines Mother    Sinusitis Mother    ADD / ADHD Brother      Social Hx: Current Social History 03/09/2022   Who lives at home: Husband Nicole Francis (31), Nicole Francis (25 years old-lives with them every other week), 4 cats, 1 dog Who  would speak for you about health care matters: Husband Nicole Francis Living will or Advance Directive: None Transportation: Owns car Important Relationships & Pets: 4 cats, 1 dog Current Stressors: Nausea and vomiting episodes/health concerns 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   Medications: Gabapentin 200 mg 3 times daily Hydroxyzine 25 mg as needed Afrin 0.05% nasal spray as needed Rizatriptan 10 mg as needed Topiramate 150 mg nightly Trazodone 25 mg nightly   Preventative Screening Pap test: Never done  Objective:  BP 103/75   Pulse 74   Ht 5' (1.524 m)   Wt 116 lb 12.8 oz (53 kg)   SpO2 99%   BMI 22.81 kg/m  Vitals and nursing note reviewed  General: well nourished, in no acute distress HEENT: normocephalic, TM's visualized bilaterally, no scleral icterus or conjunctival pallor, no nasal discharge, moist mucous membranes, good dentition without erythema or discharge noted in posterior oropharynx Neck: supple, non-tender, without lymphadenopathy Cardiac: RRR, clear S1 and S2, no murmurs, rubs, or gallops Respiratory: clear to auscultation bilaterally, no increased work of breathing Abdomen: soft, nontender, nondistended, no masses or organomegaly. Bowel sounds present Extremities: no edema or cyanosis. Warm, well perfused. 2+ radial and PT pulses bilaterally Skin: warm and dry, no rashes noted Neuro: alert and oriented, no focal deficits   Assessment & Plan:  Generalized anxiety disorder Following with psychiatry, getting into counseling actively.  PHQ-9 score of 10.  Patient feels that she is mostly well controlled - Continue trazodone 50 mg nightly as needed - Continue hydroxyzine nightly as needed - Continue gabapentin 200 mg 3 times daily  Chronic migraine without aura with status migrainosus, not intractable Currently being managed by neurology.  Is overall  stable.  Has about 4-5 migraines per month (improved from 12 previously) - Continue to follow with neurology - Continue topiramate 150 mg nightly - Continue rizatriptan 10 mg as needed  Nexplanon in place Was placed in 2018, needs removal and reinsertion. - Patient to schedule appointment for procedural visit - Counseled on other versions of contraception, patient wishes to continue with Nexplanon  Intermittent vomiting Symptoms do not seem to be associated solely with menstrual cycles.  Unclear etiology of symptoms, do not feel that there is a neuropathic component to this at this time.  Consider somatic symptoms but do not want to incur on this.  At this time, do not feel that specific GI work-up is warranted but if persistent over the next several months or not improving with treatment will consider this. - Trialing Atarax for symptom relief as needed - Consider Zofran ODT as needed if episodes persist  Healthcare maintenance Patient has never had a Pap smear.  Has history of trauma, will need to be very delicate regarding this.  Educated patient extensively on procedure and recommend following up with therapist for counseling. - Patient instructed to schedule Pap smear when she feels appropriate - We will complete chlamydia screening at that time as well  -Hepatitis C screening and HIV screening with next blood draw    Return in about 2 weeks (around 03/23/2022) for Nexplanon.   Evelena Leyden, DO, PGY-2

## 2022-04-01 ENCOUNTER — Encounter: Payer: Self-pay | Admitting: Family Medicine

## 2022-04-01 ENCOUNTER — Ambulatory Visit (INDEPENDENT_AMBULATORY_CARE_PROVIDER_SITE_OTHER): Payer: Commercial Managed Care - PPO | Admitting: Family Medicine

## 2022-04-01 VITALS — BP 95/49 | HR 89 | Ht 60.0 in | Wt 115.1 lb

## 2022-04-01 DIAGNOSIS — Z308 Encounter for other contraceptive management: Secondary | ICD-10-CM

## 2022-04-01 DIAGNOSIS — Z3046 Encounter for surveillance of implantable subdermal contraceptive: Secondary | ICD-10-CM | POA: Diagnosis not present

## 2022-04-01 DIAGNOSIS — Z975 Presence of (intrauterine) contraceptive device: Secondary | ICD-10-CM | POA: Diagnosis not present

## 2022-04-01 LAB — POCT URINE PREGNANCY: Preg Test, Ur: NEGATIVE

## 2022-04-01 NOTE — Patient Instructions (Signed)

## 2022-04-01 NOTE — Progress Notes (Signed)
    SUBJECTIVE:   CHIEF COMPLAINT / HPI:   Patient presents for Nexplanon removal and reinsertion. No GYN concerns. Never had a pap smear, have done counseling with plans to complete soon.  No other gynecologic concerns.  PERTINENT  PMH / PSH: Reviewed  OBJECTIVE:   BP (!) 95/49   Pulse 89   Ht 5' (1.524 m)   Wt 115 lb 2 oz (52.2 kg)   LMP 03/21/2022   SpO2 98%   BMI 22.48 kg/m   Gen: well-appearing, NAD CV: RRR, no m/r/g appreciated, no peripheral edema Pulm: CTAB, no wheezes/crackles GI: soft, non-tender, non-distended  Nexplanon Removal and Insertion  Patient was given informed consent for removal of her Implanon and insertion of Nexplanon.  Patient does understand that irregular bleeding is a very common side effect of this medication. She was advised to have backup contraception for one week after replacement of the implant. Pregnancy test in clinic today was negative.  Appropriate time out taken. Implanon site identified. Area prepped in usual sterile fashon. One ml of 1% lidocaine was used to anesthetize the area at the distal end of the implant. A small stab incision was made right beside the implant on the distal portion. The Nexplanon rod was grasped using hemostats and removed without difficulty. There was minimal blood loss. There were no complications. Area was then injected with 3 ml of 1 % lidocaine. She was re-prepped with betadine, Nexplanon removed from packaging, Device confirmed in needle, then inserted full length of needle and withdrawn per handbook instructions. Nexplanon was able to palpated in the patient's arm; patient palpated the insert herself.  There was minimal blood loss. Patient insertion site covered with guaze and a pressure bandage to reduce any bruising. The patient tolerated the procedure well and was given post procedure instructions.   Exp: [id number]   [id number]     ASSESSMENT/PLAN:   Contraception management Counseling provided  for all birth control options. Patient prefers to continue with nexplanon re-insertion at this time. May consider depo injections if she decides against nexlanon in the future. - Negative Pregnancy test today - Counseling provided - Continue condoms for 1 week - Nexplanon re-inserted today without complications - Due for removal on 04/01/2025  Healthcare maintenance Delicate subject of pap smear discussed again today. Patient to return when comfortable with the procedure.    Nicole Leyden, DO [facility]

## 2022-04-04 MED ORDER — ETONOGESTREL 68 MG ~~LOC~~ IMPL
68.0000 mg | DRUG_IMPLANT | Freq: Once | SUBCUTANEOUS | Status: AC
  Administered 2022-04-01: 68 mg via SUBCUTANEOUS

## 2022-04-04 NOTE — Addendum Note (Signed)
Addended by: Veronda Prude on: 04/04/2022 04:58 PM   Modules accepted: Orders

## 2022-04-19 ENCOUNTER — Encounter: Payer: Self-pay | Admitting: Family Medicine

## 2022-04-19 ENCOUNTER — Other Ambulatory Visit (HOSPITAL_COMMUNITY): Payer: Self-pay | Admitting: Psychiatry

## 2022-04-20 ENCOUNTER — Other Ambulatory Visit: Payer: Self-pay | Admitting: *Deleted

## 2022-04-20 MED ORDER — TOPIRAMATE 50 MG PO TABS: 150.0000 mg | ORAL_TABLET | Freq: Every day | ORAL | 2 refills | Status: AC

## 2022-05-17 ENCOUNTER — Telehealth (INDEPENDENT_AMBULATORY_CARE_PROVIDER_SITE_OTHER): Payer: No Payment, Other | Admitting: Psychiatry

## 2022-05-17 ENCOUNTER — Encounter (HOSPITAL_COMMUNITY): Payer: Self-pay | Admitting: Psychiatry

## 2022-05-17 DIAGNOSIS — F411 Generalized anxiety disorder: Secondary | ICD-10-CM

## 2022-05-17 MED ORDER — TRAZODONE HCL 50 MG PO TABS: 25.0000 mg | ORAL_TABLET | Freq: Every day | ORAL | 0 refills | Status: DC

## 2022-05-17 MED ORDER — GABAPENTIN 300 MG PO CAPS: 300.0000 mg | ORAL_CAPSULE | Freq: Three times a day (TID) | ORAL | 0 refills | Status: DC

## 2022-05-17 NOTE — Progress Notes (Signed)
BH NP OP Progress Note  Patient Identification: Nicole Francis MRN:  [id number] Date of Evaluation:  05/17/2022  Chief Complaint:   Chief Complaint   Anxiety; Follow-up     Visit Diagnosis: General anxiety disorder  History of Present Illness:  26 y/o female presents today for f/u anxiety. Anxiety is moderate related to getting married recently and her family not being helpful.  Her husband's side of the family are helpful.  They are also doing updates on the house which is stressful along with dealing with her step-son's mother.  Medications are doing pretty well except she hasn't had her refill on Trazodone which works well.  She has seen a doctor and a dentist, taking care of herself.  Appetite is fair, slight weight loss of less than 2 pounds.  No depression, suicidal/homicidal ideations, hallucinations, or substance use.  Discussed therapy and some reluctance as she did not have good past experiences.  Past Psychiatric History: Panic d/o, social anxiety, PTSD, bipolar d/o, depression   Past Medical History:  Past Medical History:  Diagnosis Date   Migraine     Past Surgical History:  Procedure Laterality Date   OTHER SURGICAL HISTORY Left    Nexplanon     Family Psychiatric History: see below  Family History:  Family History  Problem Relation Age of Onset   Bipolar disorder Mother    High Cholesterol Mother    High blood pressure Mother    Migraines Mother    Sinusitis Mother    ADD / ADHD Brother     Social History:   Social History   Socioeconomic History   Marital status: Single    Spouse name: Not on file   Number of children: 0   Years of education: Not on file   Highest education level: Not on file  Occupational History   Not on file  Tobacco Use   Smoking status: Never   Smokeless tobacco: Never   Tobacco comments:    vapes nicotine  Substance and Sexual Activity   Alcohol use: No    Alcohol/week: 0.0 standard drinks of alcohol   Drug use:  No   Sexual activity: Yes    Birth control/protection: Implant  Other Topics Concern   Not on file  Social History Narrative   Skyeler graduated from high school. She plans on attending [employer] in the fall and majoring in Psychology.   She lives with her adoptive grandmother and biological, maternal half-brother.       Caffeine: maybe 1 cup/week   Social Determinants of Health   Financial Resource Strain: Not on file  Food Insecurity: Not on file  Transportation Needs: Not on file  Physical Activity: Not on file  Stress: Not on file  Social Connections: Not on file    Additional Social History: lives with her husband and his child  Allergies:  No Known Allergies  Metabolic Disorder Labs: No results found for: "HGBA1C", "MPG" No results found for: "PROLACTIN" No results found for: "CHOL", "TRIG", "HDL", "CHOLHDL", "VLDL", "LDLCALC" No results found for: "TSH"  Therapeutic Level Labs: No results found for: "LITHIUM" No results found for: "CBMZ" No results found for: "VALPROATE"  Current Medications: Current Outpatient Medications  Medication Sig Dispense Refill   gabapentin (NEURONTIN) 100 MG capsule Take 2 capsules (200 mg total) by mouth 3 (three) times daily. Sig is 200 mg three times a day, no allowance for prn for this medicine 540 capsule 0   hydrOXYzine (ATARAX) 25 MG tablet Take  1 tablet (25 mg total) by mouth at bedtime as needed (sleep). 30 tablet 2   oxymetazoline (AFRIN) 0.05 % nasal spray Place 1 spray into both nostrils 2 (two) times daily as needed for congestion.     rizatriptan (MAXALT-MLT) 10 MG disintegrating tablet Take 1 tablet (10 mg total) by mouth as needed for migraine. May repeat in 2 hours if needed 9 tablet 11   topiramate (TOPAMAX) 50 MG tablet Take 3 tablets (150 mg total) by mouth at bedtime. Call [phone] to schedule yearly follow up around 02/24/23 270 tablet 2   traZODone (DESYREL) 50 MG tablet TAKE 0.5 TABLETS (25 MG TOTAL) BY MOUTH AT  BEDTIME. 45 tablet 0   No current facility-administered medications for this visit.    Musculoskeletal: Strength & Muscle Tone: within normal limits Gait & Station: normal Patient leans: N/A  Psychiatric Specialty Exam: Review of Systems  Psychiatric/Behavioral:  The patient is nervous/anxious.   All other systems reviewed and are negative.   There were no vitals taken for this visit.There is no height or weight on file to calculate BMI.  General Appearance: Casual  Eye Contact:  Good  Speech:  Clear and Coherent  Volume:  Normal  Mood:  Anxiety  Affect:  Congruent  Thought Process:  Coherent and Descriptions of Associations: Intact  Orientation:  Full (Time, Place, and Person)  Thought Content:  WDL and Logical  Suicidal Thoughts:  No  Homicidal Thoughts:  No  Memory:  Immediate;   Good Recent;   Good Remote;   Good  Judgement:  Good  Insight:  Good  Psychomotor Activity:  Good  Concentration:  Concentration: Fair and Attention Span: Fair  Recall:  Good  Fund of Knowledge:Good  Language: Good  Akathisia:  No  Handed:  Right  AIMS (if indicated):  not done  Assets:  Housing Leisure Time Resilience Social Support  ADL's:  Intact  Cognition: WNL  Sleep:  Good   Screenings: GAD-7    Flowsheet Row Office Visit from 03/09/2022 in [facility] Video Visit from 08/11/2020 in [facility]  Total GAD-7 Score 13 18      PHQ2-9    Flowsheet Row Office Visit from 04/01/2022 in [facility] Office Visit from 03/09/2022 in [facility] Video Visit from 11/16/2020 in [facility]  PHQ-2 Total Score 2 2 1   PHQ-9 Total Score 11 10 --      Flowsheet Row Video Visit from 11/16/2020 in [facility]  C-SSRS RISK CATEGORY No Risk     GAD of 18  Assessment and Plan:  General anxiety disorder: Increase gabapentin 200 mg TID to 300  mg TID -Follow up in 3 months -F/up with therapy establishment  Insomnia: -continue Trazodone 50 mg daily at bedtime PRN  Virtual Visit via Video Note  I connected with [facility] on 05/17/22 at 11:00 AM EDT by a video enabled telemedicine application and verified that I am speaking with the correct person using two identifiers.  Location: Patient: Home Provider: Home office   I discussed the limitations of evaluation and management by telemedicine and the availability of in person appointments. The patient expressed understanding and agreed to proceed.  Follow Up Instructions: Follow up in 3 months   I discussed the assessment and treatment plan with the patient. The patient was provided an opportunity to ask questions and all were answered. The patient agreed  with the plan and demonstrated an understanding of the instructions.   The patient was advised to call back or seek an in-person evaluation if the symptoms worsen or if the condition fails to improve as anticipated.  I provided 10 minutes of non-face-to-face time during this encounter.   Nanine Means, NP   Nanine Means, NP 8/22/202311:05 AM

## 2022-06-09 ENCOUNTER — Encounter: Payer: Self-pay | Admitting: Family Medicine

## 2022-07-21 ENCOUNTER — Other Ambulatory Visit (HOSPITAL_COMMUNITY): Payer: Self-pay | Admitting: Psychiatry

## 2022-09-12 ENCOUNTER — Other Ambulatory Visit (HOSPITAL_COMMUNITY)
Admission: RE | Admit: 2022-09-12 | Discharge: 2022-09-12 | Disposition: A | Discharge status: Home / Self Care | Payer: Commercial Managed Care - PPO | Source: Ambulatory Visit | Attending: Family Medicine | Admitting: Family Medicine

## 2022-09-12 ENCOUNTER — Ambulatory Visit (INDEPENDENT_AMBULATORY_CARE_PROVIDER_SITE_OTHER): Payer: Commercial Managed Care - PPO | Admitting: Family Medicine

## 2022-09-12 ENCOUNTER — Encounter: Payer: Self-pay | Admitting: Family Medicine

## 2022-09-12 VITALS — BP 102/70 | HR 80 | Ht 60.0 in | Wt 108.0 lb

## 2022-09-12 DIAGNOSIS — Z114 Encounter for screening for human immunodeficiency virus [HIV]: Secondary | ICD-10-CM

## 2022-09-12 DIAGNOSIS — R42 Dizziness and giddiness: Secondary | ICD-10-CM

## 2022-09-12 DIAGNOSIS — G4700 Insomnia, unspecified: Secondary | ICD-10-CM

## 2022-09-12 DIAGNOSIS — K219 Gastro-esophageal reflux disease without esophagitis: Secondary | ICD-10-CM | POA: Diagnosis not present

## 2022-09-12 DIAGNOSIS — Z1159 Encounter for screening for other viral diseases: Secondary | ICD-10-CM

## 2022-09-12 DIAGNOSIS — F411 Generalized anxiety disorder: Secondary | ICD-10-CM | POA: Diagnosis not present

## 2022-09-12 DIAGNOSIS — Z124 Encounter for screening for malignant neoplasm of cervix: Secondary | ICD-10-CM

## 2022-09-12 DIAGNOSIS — Z0000 Encounter for general adult medical examination without abnormal findings: Secondary | ICD-10-CM

## 2022-09-12 MED ORDER — OMEPRAZOLE 20 MG PO CPDR: 20.0000 mg | DELAYED_RELEASE_CAPSULE | Freq: Every day | ORAL | 0 refills | Status: DC

## 2022-09-12 NOTE — Assessment & Plan Note (Signed)
Still a problem, has interrupted sleep patterns and is on trazodone. Likely related to anxiety issues as well. Feel that psychiatry follow-up would be the best first step with management.

## 2022-09-12 NOTE — Patient Instructions (Addendum)
For your reflux, I am sending in a daily medication called Omeprazole that we will take for the next month. You can also take Tums during the day as needed to help with the reflux and hopefully mitigate some of the vomiting.  For the dizziness, I recommend that we go ahead and start with making sure you're getting enough food/protein. You can try the protein supplements as you are able if larger food amounts are worsening your vomiting. Your blood pressure was stable during your testing today, but make sure to take your movements slowly so we don't risk falling or blacking out.   Make sure to touch base with your psychiatrist regarding your anxiety and insomnia. I wish there were better medications to help with your sleep but I think talking with her for now will be the best first step.  Make sure to also check in with your neurologist if you are having concerns.   Let's make sure to keep a close follow-up in the next few weeks so we can keep on top of your medical concerns. Your pap smear results will take a few days to come back and I will let you know the follow-up plans once the results are done.

## 2022-09-12 NOTE — Assessment & Plan Note (Signed)
Orthostatic vitals within normal limits. Patient with dizziness with standing. - CBC with diff and iron panel to rule out anemia - Counseled on safe movements - Increase protein intake

## 2022-09-12 NOTE — Assessment & Plan Note (Signed)
Reflux noted with some episodes of vomiting due to the acid.  - Omeprazole 20mg  x4 weeks - Tums as needed

## 2022-09-12 NOTE — Assessment & Plan Note (Signed)
Significant anxiety levels still present, is currently on medications for control and follows with psychiatry.  - Psychiatrist contact information given to patient - No changes to management

## 2022-09-12 NOTE — Assessment & Plan Note (Addendum)
-   Pap smear completed today.  - HIV screening - Hep C screening

## 2022-09-12 NOTE — Progress Notes (Signed)
    SUBJECTIVE:   CHIEF COMPLAINT / HPI:   Acid Reflux - Will sometimes throw up because of it and will last for a few days - Feels really hot before these issues - Has normal bowel movements  - Not taking anything over the counter - Worse with getting too hot (issue since she is always)  Crippling Anxiety  Insomnia - Sleep issues with maybe 4-6 hours of interrupted sleep - Waking up with nightmares  - Has psychiatrist that she is going to call (didn't have her number before)  Sickness - Associated with the vomiting - Sometimes associated with menstrual cycles - Happening every few weeks  Abnormal Menstrual cycle - Last period lasted about 2 weeks  Dizziness - Gets significant dizziness if her head goes below her head - Nutrition is not the greatest due to her lack of appetite - Tries to mix in some proteins - Doing well with water intake    PERTINENT  PMH / PSH: Reviewed  OBJECTIVE:   BP 102/70   Pulse 80   Ht 5' (1.524 m)   Wt 108 lb (49 kg)   SpO2 98%   BMI 21.09 kg/m   General: NAD, well-appearing, well-nourished Respiratory: No respiratory distress, breathing comfortably, able to speak in full sentences Skin: warm and dry, no rashes noted on exposed skin Psych: Appropriate affect and mood Pelvic exam: normal external genitalia, vulva, vagina, cervix, uterus and adnexa, PAP: Pap smear done today, exam chaperoned by Jone Baseman, CMA.  ASSESSMENT/PLAN:   Generalized anxiety disorder Significant anxiety levels still present, is currently on medications for control and follows with psychiatry.  - Psychiatrist contact information given to patient - No changes to management  Insomnia Still a problem, has interrupted sleep patterns and is on trazodone. Likely related to anxiety issues as well. Feel that psychiatry follow-up would be the best first step with management.   Gastric reflux Reflux noted with some episodes of vomiting due to the acid.  -  Omeprazole 20mg  x4 weeks - Tums as needed  Healthcare maintenance - Pap smear completed today.  - HIV screening - Hep C screening  Dizziness Orthostatic vitals within normal limits. Patient with dizziness with standing. - CBC with diff and iron panel to rule out anemia - Counseled on safe movements - Increase protein intake     Tahlor Berenguer, DO [facility]

## 2022-09-13 LAB — HIV ANTIBODY (ROUTINE TESTING W REFLEX): HIV Screen 4th Generation wRfx: NONREACTIVE

## 2022-09-13 LAB — CBC
Hematocrit: 36.1 % (ref 34.0–46.6)
Hemoglobin: 12.3 g/dL (ref 11.1–15.9)
MCH: 32.7 pg (ref 26.6–33.0)
MCHC: 34.1 g/dL (ref 31.5–35.7)
MCV: 96 fL (ref 79–97)
Platelets: 223 10*3/uL (ref 150–450)
RBC: 3.76 x10E6/uL — ABNORMAL LOW (ref 3.77–5.28)
RDW: 12.5 % (ref 11.7–15.4)
WBC: 5.6 10*3/uL (ref 3.4–10.8)

## 2022-09-13 LAB — IRON,TIBC AND FERRITIN PANEL
Ferritin: 21 ng/mL (ref 15–150)
Iron Saturation: 31 % (ref 15–55)
Iron: 78 ug/dL (ref 27–159)
Total Iron Binding Capacity: 251 ug/dL (ref 250–450)
UIBC: 173 ug/dL (ref 131–425)

## 2022-09-13 LAB — HCV INTERPRETATION

## 2022-09-13 LAB — HCV AB W REFLEX TO QUANT PCR: HCV Ab: NONREACTIVE

## 2022-09-15 LAB — CYTOLOGY - PAP: Diagnosis: NEGATIVE

## 2022-10-11 ENCOUNTER — Other Ambulatory Visit (HOSPITAL_COMMUNITY): Payer: Self-pay | Admitting: Psychiatry

## 2022-10-11 ENCOUNTER — Other Ambulatory Visit: Payer: Self-pay | Admitting: Family Medicine

## 2022-10-20 ENCOUNTER — Encounter: Payer: Self-pay | Admitting: Family Medicine

## 2022-10-20 ENCOUNTER — Ambulatory Visit (INDEPENDENT_AMBULATORY_CARE_PROVIDER_SITE_OTHER): Payer: Commercial Managed Care - PPO | Admitting: Family Medicine

## 2022-10-20 VITALS — BP 108/69 | HR 74 | Ht 60.0 in | Wt 104.6 lb

## 2022-10-20 DIAGNOSIS — K219 Gastro-esophageal reflux disease without esophagitis: Secondary | ICD-10-CM | POA: Diagnosis not present

## 2022-10-20 DIAGNOSIS — R42 Dizziness and giddiness: Secondary | ICD-10-CM

## 2022-10-20 DIAGNOSIS — F411 Generalized anxiety disorder: Secondary | ICD-10-CM | POA: Diagnosis not present

## 2022-10-20 MED ORDER — OMEPRAZOLE 20 MG PO CPDR: 20.0000 mg | DELAYED_RELEASE_CAPSULE | Freq: Every day | ORAL | 1 refills | Status: AC

## 2022-10-20 NOTE — Assessment & Plan Note (Signed)
Has improved with increased protein intake. - continue to work on increasing protein - journal of when symptoms happen

## 2022-10-20 NOTE — Progress Notes (Signed)
    SUBJECTIVE:   CHIEF COMPLAINT / HPI:   Patient accompanied by her husband   Sickness  Nausea - Symptoms have improved since taking the omeprazole and atarax  Anxiety - Hasn't seen psychiatrist in 6 months - Has been trying to get an appointment but can't reach anyone - Stopped the gabapentin because she didn't feel like it was helping - Was concerned that she was getting refills without having seen her psych provider  Dizziness - Decreasing in occurrence, has only happened 1-2 times since last visit - Has been working to increase her protein intake  PERTINENT  PMH / PSH: Reviewed  OBJECTIVE:   BP 108/69   Pulse 74   Ht 5' (1.524 m)   Wt 104 lb 9.6 oz (47.4 kg)   LMP 10/09/2022   SpO2 100%   BMI 20.43 kg/m   General: NAD, well-appearing, well-nourished Respiratory: No respiratory distress, breathing comfortably, able to speak in full sentences Skin: warm and dry, no rashes noted on exposed skin Psych: Appropriate affect and mood  ASSESSMENT/PLAN:   Gastric reflux Has had improvement with the omeprazole, will do a full 2 months of treatment and patient can trial off to see if it has resolved or if PPI needed for longer-term.  - Refill of omeprazole provided  Generalized anxiety disorder - Patient given psychologytoday reference for finding new psychiatrist - Discontinued gabapentin per patient preference, can restart if patient desires  Dizziness Has improved with increased protein intake. - continue to work on increasing protein - journal of when symptoms happen     Nicole Francis, [place]

## 2022-10-20 NOTE — Patient Instructions (Signed)
I sent in a refill for your medication, if you want to do that amount of it and then try off of it and see if you are still having symptoms that is perfectly fine.  You have enough pills sent in for refills if you would like to continue taking it.  The website that you can visit to find a new psychiatrist is [email or web address], I recommend checking in and trying to get a new one, you can talk with them about your anxiety as well as the concern for ADHD.  Lets follow-up either in the next month, or if you want to space it out we can follow-up in May or June after my maternity leave but before I moved practices.

## 2022-10-20 NOTE — Assessment & Plan Note (Signed)
-  Patient given psychologytoday reference for finding new psychiatrist - Discontinued gabapentin per patient preference, can restart if patient desires

## 2022-10-20 NOTE — Assessment & Plan Note (Signed)
Has had improvement with the omeprazole, will do a full 2 months of treatment and patient can trial off to see if it has resolved or if PPI needed for longer-term.  - Refill of omeprazole provided

## 2022-11-21 ENCOUNTER — Other Ambulatory Visit (HOSPITAL_COMMUNITY): Payer: Self-pay | Admitting: Psychiatry

## 2023-02-07 NOTE — Progress Notes (Deleted)
No chief complaint on file.    HISTORY OF PRESENT ILLNESS:  02/07/23 ALL:  Nicole Francis returns for follow up for migraines. She was last seen 01/2022 and ding well on trazodone and rizatriptan.   02/22/22 ALL (Mychart):  Nicole Francis returns for follow up for migraines. We continued topiramate 150mg  daily and added amitriptyline 10mg  at last visit 09/2021. Rizatriptan and ondansetron continued for abortive therapy. She sent Mychart message reporting intolerance and worsening anxiety to amitriptyline. She was encouraged to call psychiatry. She is now taking gabapentin, trazodone (hasn't started yet) and hydroxyzine for mood management. Since, she reports having about 4-6 headaches per month with 1 migraine on average per month. She takes Tylenol for milder headaches. Rizatriptan usually works but she has not filled since January. Ondansetron has not helped with nausea. In fact, she reports that she vomits every time she takes it. She does not have a PCP.   10/11/2021 ALL: Nicole Francis returns for follow up for migraines. She continues topiramate 150mg  daily. Ajovy last in September. She did not follow up with financial assistance. Headaches are stable. No significant change. She is having 12-16 headache days with 2-3 migrainous headaches a month.  Rizatriptan and ondansetron help with abortive therapy. She also uses Tylenol OTC, usually 2-3 times a week. She is uninsured. Unemployed. She was recently engaged. She continues to see psychiatry regularly. She feels mood is stable.    06/02/2021 ALL: Nicole Francis returns for follow up for migraine headaches. She continues topiramate 150mg  at bedtime. We added Ajovy injections at last visit visit. She reports headaches significantly improved. She did not have any headaches in June and only one mild headache in July and August. Rizatripan and ondansetron aid in abortive therapy. She also continues gabapentin 200mg  TID for anxiety with psychiatry. Nausea was much better in  June and July with very little vomiting but reports nausea returned in August. She has gained 6 pounds.   01/27/2021 ALL:  Nicole Francis is a 27 y.o. female here today for follow up for migraines. She continues topiramate 150mg  at bedtime. She has continued rizatriptan with Tylenol that seems to help some. She is unsure if Nurtec helped. Gabapentin was increased to 200mg  TID by psychiatry. She feels this has helped with mood but not much with headaches. She continues to have 2-3 headaches a week, most are migrainous. She is taking rizatriptan at least 2 times a month. She has not noted any specific side effects with taking topiramate but reports losing about 20 pounds. Weight loss confirmed with review of chart. Unclear over what period of time. She reports vomiting "everything" she eats once a week. Most of these days she has a headache. There have been a few episodes where she vomits and does not have a headache. She is using ondansetron but does not usually repeat dose. She reports submitting paperwork for patient assistance. She can not tell me when. She has not followed up.   HISTORY (copied from Erik Obey previous note)  10/13/20:   Ms. Brandwein is a 27 year old female with a history of migraine headaches.  She reports that she continues to get 1-2 headaches a week.  She reports that some of these headaches are dull headaches for which she just takes Tylenol.  There are others that are more severe and she does use the rizatriptan.  She states that typically when she takes the rizatriptan her headache will resolve in 1 hour but then it may come back.  At the last visit  she was given samples of Nurtec but she "does not know what she did with them."  It seems that she never tried this medication.  The patient continues on Topamax 150 mg at bedtime.  She recently was put on gabapentin 100 mg 3 times a day for anxiety and bipolar per the patient.  She returns today for an evaluation.   HISTORY  Interval history: migraines better but not great. We discussed options. She "forgot" to email me and also "forgot" to seek out [facility] financial assistance. We will increae topiramate.   HPI:  Nicole Francis is a 27 y.o. female here as requested by ED for migraines. Migraines since the age of 79. She tried vitamin b and magnesium and didn't help. Can be the entire top of the head, sometimes the sides of the head, pulsating/pounding/throbbing,nausea,light and sound sensitivity, movement makes it work, skin sensitivity as well. Mom has migraines. Migraines can last 24 hours to several weeks on and off. The last few days she has been very sick because of a migraine and now she is feeling better. She does not take OTC meds, maybe tylenol. No aura. She gets dizzy, lightheaded, cold or hot. Over the last few years they have worsened in frequency and some have been more severe, quality the same, she wakes with headaches, she can have blurry vision, nor positional or exertional. Tylenol may help, unknown triggers, she has a good routine, she sleeps well, eats well, exercise. Stays hydrated. She has a lot of nausea.    Reviewed notes, labs and imaging from outside physicians, which showed:   Cbc nml, cmp unremarkab;e BUN 12 and cr 0.71   MRI of the brain 07/2015    There is no evidence of acute infarct, intracranial hemorrhage, mass, midline shift, or extra-axial fluid collection. Ventricles and sulci are normal. Brain is normal in signal.   Orbits are unremarkable. There is mild bilateral ethmoid, sphenoid, and maxillary sinus mucosal thickening with small fluid levels in the left greater than right maxillary sinuses. Mastoid air cells clear. Major intracranial vascular flow voids are preserved.   IMPRESSION: 1. Unremarkable appearance of the brain. 2. Paranasal sinus mucosal thickening and fluid. Correlate for acute sinusitis.   REVIEW OF SYSTEMS: Out of a complete 14 system review of symptoms, the  patient complains only of the following symptoms, headaches, anxiety, vomiting, and all other reviewed systems are negative.   ALLERGIES: No Known Allergies   HOME MEDICATIONS: Outpatient Medications Prior to Visit  Medication Sig Dispense Refill   hydrOXYzine (ATARAX) 25 MG tablet Take 1 tablet (25 mg total) by mouth at bedtime as needed (sleep). 30 tablet 2   omeprazole (PRILOSEC) 20 MG capsule Take 1 capsule (20 mg total) by mouth daily. 90 capsule 1   oxymetazoline (AFRIN) 0.05 % nasal spray Place 1 spray into both nostrils 2 (two) times daily as needed for congestion.     rizatriptan (MAXALT-MLT) 10 MG disintegrating tablet Take 1 tablet (10 mg total) by mouth as needed for migraine. May repeat in 2 hours if needed 9 tablet 11   topiramate (TOPAMAX) 50 MG tablet Take 3 tablets (150 mg total) by mouth at bedtime. Call [phone] to schedule yearly follow up around 02/24/23 270 tablet 2   traZODone (DESYREL) 50 MG tablet TAKE 1/2 TABLET BY MOUTH AT BEDTIME 45 tablet 0   No facility-administered medications prior to visit.     PAST MEDICAL HISTORY: Past Medical History:  Diagnosis Date   Migraine  PAST SURGICAL HISTORY: Past Surgical History:  Procedure Laterality Date   OTHER SURGICAL HISTORY Left    Nexplanon      FAMILY HISTORY: Family History  Problem Relation Age of Onset   Bipolar disorder Mother    High Cholesterol Mother    High blood pressure Mother    Migraines Mother    Sinusitis Mother    ADD / ADHD Brother      SOCIAL HISTORY: Social History   Socioeconomic History   Marital status: Single    Spouse name: Not on file   Number of children: 0   Years of education: Not on file   Highest education level: Not on file  Occupational History   Not on file  Tobacco Use   Smoking status: Never   Smokeless tobacco: Never   Tobacco comments:    vapes nicotine  Substance and Sexual Activity   Alcohol use: No    Alcohol/week: 0.0 standard  drinks of alcohol   Drug use: No   Sexual activity: Yes    Birth control/protection: Implant  Other Topics Concern   Not on file  Social History Narrative   Liller graduated from high school. She plans on attending [employer] in the fall and majoring in Psychology.   She lives with her adoptive grandmother and biological, maternal half-brother.       Caffeine: maybe 1 cup/week   Social Determinants of Health   Financial Resource Strain: Not on file  Food Insecurity: Not on file  Transportation Needs: Not on file  Physical Activity: Not on file  Stress: Not on file  Social Connections: Not on file  Intimate Partner Violence: Not on file      PHYSICAL EXAM  There were no vitals filed for this visit.    There is no height or weight on file to calculate BMI.   Generalized: Well developed, in no acute distress  Cardiology: normal rate and rhythm, no murmur auscultated  Respiratory: clear to auscultation bilaterally    Neurological examination  Mentation: Alert oriented to time, place, history taking. Follows all commands speech and language fluent Cranial nerve II-XII: Pupils were equal round reactive to light. Extraocular movements were full, visual field were full on confrontational test. Facial sensation and strength were normal. Head turning and shoulder shrug  were normal and symmetric. Motor: The motor testing reveals 5 over 5 strength of all 4 extremities. Good symmetric motor tone is noted throughout.  Gait and station: Gait is normal.     DIAGNOSTIC DATA (LABS, IMAGING, TESTING) - I reviewed patient records, labs, notes, testing and imaging myself where available.  Lab Results  Component Value Date   WBC 5.6 09/12/2022   HGB 12.3 09/12/2022   HCT 36.1 09/12/2022   MCV 96 09/12/2022   PLT 223 09/12/2022      Component Value Date/Time   NA 135 03/14/2020 2214   K 3.6 03/14/2020 2214   CL 101 03/14/2020 2214   CO2 21 (L) 03/14/2020 2214   GLUCOSE 108 (H)  03/14/2020 2214   BUN 12 03/14/2020 2214   CREATININE 0.71 03/14/2020 2214   CALCIUM 9.5 03/14/2020 2214   PROT 8.2 (H) 03/14/2020 2214   ALBUMIN 4.8 03/14/2020 2214   AST 20 03/14/2020 2214   ALT 32 03/14/2020 2214   ALKPHOS 57 03/14/2020 2214   BILITOT 1.7 (H) 03/14/2020 2214   GFRNONAA >60 03/14/2020 2214   GFRAA >60 03/14/2020 2214   No results found for: "CHOL", "HDL", "LDLCALC", "LDLDIRECT", "  TRIG", "CHOLHDL" No results found for: "HGBA1C" No results found for: "VITAMINB12" No results found for: "TSH"      No data to display               No data to display           ASSESSMENT AND PLAN  27 y.o. year old female  has a past medical history of Migraine. here with   No diagnosis found.  Lorree reports migraines are stable . We will continue topiramate 150mg  QHS and rizatritpan as needed. Possible side effects reviewed. She will continue close follow up with psychiatry. She is aware that pregnancy is not advised while on topiramate. She has nexplanon for contraception. She will return to see me in 1 year. She verbalizes understanding and agreement with this plan.    No orders of the defined types were placed in this encounter.     No orders of the defined types were placed in this encounter.     Shawnie Dapper, MSN, FNP-C 02/07/2023, 9:11 AM  [facility] [address] [place], [place] [address] [phone]

## 2023-02-08 ENCOUNTER — Ambulatory Visit: Payer: Commercial Managed Care - PPO | Admitting: Family Medicine

## 2023-02-08 ENCOUNTER — Encounter: Payer: Self-pay | Admitting: Family Medicine
